# Patient Record
Sex: Female | Born: 1980
Health system: Southern US, Community
[De-identification: ages and names within clinical notes are randomized; demographics above are authoritative.]

## PROBLEM LIST (undated history)

## (undated) DIAGNOSIS — Z789 Other specified health status: Secondary | ICD-10-CM

## (undated) DIAGNOSIS — D62 Acute posthemorrhagic anemia: Secondary | ICD-10-CM

## (undated) HISTORY — PX: WISDOM TOOTH EXTRACTION: SHX21

## (undated) HISTORY — PX: LEEP: SHX91

---

## 1999-10-04 ENCOUNTER — Other Ambulatory Visit: Admission: RE | Admit: 1999-10-04 | Discharge: 1999-10-04 | Payer: Self-pay | Admitting: Internal Medicine

## 2001-04-24 ENCOUNTER — Other Ambulatory Visit: Admission: RE | Admit: 2001-04-24 | Discharge: 2001-04-24 | Payer: Self-pay | Admitting: Internal Medicine

## 2003-09-08 ENCOUNTER — Other Ambulatory Visit: Admission: RE | Admit: 2003-09-08 | Discharge: 2003-09-08 | Payer: Self-pay | Admitting: Internal Medicine

## 2003-11-04 ENCOUNTER — Ambulatory Visit (HOSPITAL_COMMUNITY): Admission: RE | Admit: 2003-11-04 | Discharge: 2003-11-04 | Payer: Self-pay | Admitting: *Deleted

## 2006-04-11 LAB — CONVERTED CEMR LAB
Pap Smear: ABNORMAL
Pap Smear: NORMAL

## 2006-05-15 ENCOUNTER — Encounter (INDEPENDENT_AMBULATORY_CARE_PROVIDER_SITE_OTHER): Payer: Self-pay | Admitting: Internal Medicine

## 2006-05-15 ENCOUNTER — Ambulatory Visit: Payer: Self-pay | Admitting: Internal Medicine

## 2006-05-15 LAB — CONVERTED CEMR LAB
Hemoglobin, Urine: NEGATIVE
Ketones, ur: NEGATIVE mg/dL
Leukocytes, UA: NEGATIVE
Nitrite: NEGATIVE
Protein, ur: NEGATIVE mg/dL
RBC / HPF: NONE SEEN (ref <3)
Urobilinogen, UA: 0.2 (ref 0.0–1.0)
pH: 7 (ref 5.0–8.0)

## 2011-02-13 NOTE — L&D Delivery Note (Signed)
Delivery Note At 11:31 AM a viable female was delivered via Vaginal, Spontaneous Delivery (Presentation: ; Occiput ).  APGAR: 9, 9; weight 7 lb 0.9 oz (3200 g).   Placenta status: Intact, Spontaneous.  Cord: 3 vessels with the following complications: None.  Cord pH: not needed  Anesthesia: Local  Episiotomy:  Lacerations: 2nd degree Suture Repair: 2.0 3.0 vicryl rapide Est. Blood Loss (mL): 500  Mom to postpartum.  Baby to nursery-stable Planning circ.  Zohar Laing A. 10/22/2011, 11:52 AM

## 2011-03-14 LAB — OB RESULTS CONSOLE ANTIBODY SCREEN: Antibody Screen: NEGATIVE

## 2011-05-08 LAB — OB RESULTS CONSOLE GC/CHLAMYDIA
Chlamydia: NEGATIVE
Gonorrhea: NEGATIVE

## 2011-05-08 LAB — OB RESULTS CONSOLE RPR: RPR: REACTIVE

## 2011-05-08 LAB — OB RESULTS CONSOLE HEPATITIS B SURFACE ANTIGEN: Hepatitis B Surface Ag: NEGATIVE

## 2011-10-19 ENCOUNTER — Inpatient Hospital Stay (HOSPITAL_COMMUNITY): Admission: AD | Admit: 2011-10-19 | Payer: Self-pay | Source: Ambulatory Visit | Admitting: Obstetrics

## 2011-10-19 ENCOUNTER — Other Ambulatory Visit: Payer: Self-pay | Admitting: Obstetrics

## 2011-10-22 ENCOUNTER — Inpatient Hospital Stay (HOSPITAL_COMMUNITY)
Admission: RE | Admit: 2011-10-22 | Discharge: 2011-10-24 | DRG: 775 | Disposition: A | Discharge status: Home / Self Care | Payer: 59 | Source: Ambulatory Visit | Attending: Obstetrics | Admitting: Obstetrics

## 2011-10-22 ENCOUNTER — Encounter (HOSPITAL_COMMUNITY): Payer: Self-pay

## 2011-10-22 DIAGNOSIS — D62 Acute posthemorrhagic anemia: Secondary | ICD-10-CM | POA: Diagnosis not present

## 2011-10-22 DIAGNOSIS — O9903 Anemia complicating the puerperium: Secondary | ICD-10-CM | POA: Diagnosis not present

## 2011-10-22 HISTORY — DX: Acute posthemorrhagic anemia: D62

## 2011-10-22 HISTORY — DX: Other specified health status: Z78.9

## 2011-10-22 LAB — CBC
MCH: 30.7 pg (ref 26.0–34.0)
MCHC: 34.7 g/dL (ref 30.0–36.0)
MCV: 88.4 fL (ref 78.0–100.0)
Platelets: 230 10*3/uL (ref 150–400)
RDW: 13 % (ref 11.5–15.5)
WBC: 12.3 10*3/uL — ABNORMAL HIGH (ref 4.0–10.5)

## 2011-10-22 LAB — TYPE AND SCREEN
ABO/RH(D): O NEG
DAT, IgG: NEGATIVE

## 2011-10-22 MED ORDER — FLEET ENEMA 7-19 GM/118ML RE ENEM: 1.0000 | ENEMA | Freq: Every day | RECTAL | Status: DC | PRN

## 2011-10-22 MED ORDER — OXYCODONE-ACETAMINOPHEN 5-325 MG PO TABS
1.0000 | ORAL_TABLET | ORAL | Status: DC | PRN
  Administered 2011-10-23 (×3): 1 via ORAL
  Filled 2011-10-22: qty 2
  Filled 2011-10-22 (×2): qty 1

## 2011-10-22 MED ORDER — LACTATED RINGERS IV SOLN
500.0000 mL | INTRAVENOUS | Status: DC | PRN
  Administered 2011-10-22: 1000 mL via INTRAVENOUS

## 2011-10-22 MED ORDER — DIPHENHYDRAMINE HCL 25 MG PO CAPS: 25.0000 mg | ORAL_CAPSULE | Freq: Four times a day (QID) | ORAL | Status: DC | PRN

## 2011-10-22 MED ORDER — SENNOSIDES-DOCUSATE SODIUM 8.6-50 MG PO TABS
2.0000 | ORAL_TABLET | Freq: Every day | ORAL | Status: DC
  Administered 2011-10-22: 2 via ORAL

## 2011-10-22 MED ORDER — OXYCODONE-ACETAMINOPHEN 5-325 MG PO TABS: 1.0000 | ORAL_TABLET | ORAL | Status: DC | PRN

## 2011-10-22 MED ORDER — ZOLPIDEM TARTRATE 5 MG PO TABS: 5.0000 mg | ORAL_TABLET | Freq: Every evening | ORAL | Status: DC | PRN

## 2011-10-22 MED ORDER — TETANUS-DIPHTH-ACELL PERTUSSIS 5-2.5-18.5 LF-MCG/0.5 IM SUSP: 0.5000 mL | Freq: Once | INTRAMUSCULAR | Status: DC

## 2011-10-22 MED ORDER — IBUPROFEN 600 MG PO TABS: 600.0000 mg | ORAL_TABLET | Freq: Four times a day (QID) | ORAL | Status: DC | PRN

## 2011-10-22 MED ORDER — PRENATAL MULTIVITAMIN CH
1.0000 | ORAL_TABLET | Freq: Every day | ORAL | Status: DC
  Administered 2011-10-22 – 2011-10-24 (×3): 1 via ORAL
  Filled 2011-10-22 (×3): qty 1

## 2011-10-22 MED ORDER — ONDANSETRON HCL 4 MG/2ML IJ SOLN: 4.0000 mg | Freq: Four times a day (QID) | INTRAMUSCULAR | Status: DC | PRN

## 2011-10-22 MED ORDER — LACTATED RINGERS IV SOLN: INTRAVENOUS | Status: DC

## 2011-10-22 MED ORDER — LIDOCAINE HCL (PF) 1 % IJ SOLN
30.0000 mL | INTRAMUSCULAR | Status: DC | PRN
  Administered 2011-10-22: 30 mL via SUBCUTANEOUS
  Filled 2011-10-22: qty 30

## 2011-10-22 MED ORDER — FLEET ENEMA 7-19 GM/118ML RE ENEM: 1.0000 | ENEMA | RECTAL | Status: DC | PRN

## 2011-10-22 MED ORDER — OXYTOCIN 40 UNITS IN LACTATED RINGERS INFUSION - SIMPLE MED
1.0000 m[IU]/min | INTRAVENOUS | Status: DC
  Filled 2011-10-22: qty 1000

## 2011-10-22 MED ORDER — TERBUTALINE SULFATE 1 MG/ML IJ SOLN: 0.2500 mg | Freq: Once | INTRAMUSCULAR | Status: DC | PRN

## 2011-10-22 MED ORDER — BUTORPHANOL TARTRATE 1 MG/ML IJ SOLN: 1.0000 mg | INTRAMUSCULAR | Status: DC | PRN

## 2011-10-22 MED ORDER — SIMETHICONE 80 MG PO CHEW: 80.0000 mg | CHEWABLE_TABLET | ORAL | Status: DC | PRN

## 2011-10-22 MED ORDER — BUTORPHANOL TARTRATE 1 MG/ML IJ SOLN
INTRAMUSCULAR | Status: AC
  Filled 2011-10-22: qty 1

## 2011-10-22 MED ORDER — OXYTOCIN 40 UNITS IN LACTATED RINGERS INFUSION - SIMPLE MED
62.5000 mL/h | Freq: Once | INTRAVENOUS | Status: AC
  Administered 2011-10-22: 62.5 mL/h via INTRAVENOUS

## 2011-10-22 MED ORDER — OXYTOCIN BOLUS FROM INFUSION
500.0000 mL | Freq: Once | INTRAVENOUS | Status: DC
  Filled 2011-10-22: qty 500

## 2011-10-22 MED ORDER — WITCH HAZEL-GLYCERIN EX PADS: 1.0000 "application " | MEDICATED_PAD | CUTANEOUS | Status: DC | PRN

## 2011-10-22 MED ORDER — ONDANSETRON HCL 4 MG PO TABS: 4.0000 mg | ORAL_TABLET | ORAL | Status: DC | PRN

## 2011-10-22 MED ORDER — ONDANSETRON HCL 4 MG/2ML IJ SOLN: 4.0000 mg | INTRAMUSCULAR | Status: DC | PRN

## 2011-10-22 MED ORDER — CITRIC ACID-SODIUM CITRATE 334-500 MG/5ML PO SOLN: 30.0000 mL | ORAL | Status: DC | PRN

## 2011-10-22 MED ORDER — IBUPROFEN 600 MG PO TABS
600.0000 mg | ORAL_TABLET | Freq: Four times a day (QID) | ORAL | Status: DC
  Administered 2011-10-22 – 2011-10-24 (×8): 600 mg via ORAL
  Filled 2011-10-22 (×8): qty 1

## 2011-10-22 MED ORDER — BISACODYL 10 MG RE SUPP: 10.0000 mg | Freq: Every day | RECTAL | Status: DC | PRN

## 2011-10-22 MED ORDER — DIBUCAINE 1 % RE OINT: 1.0000 "application " | TOPICAL_OINTMENT | RECTAL | Status: DC | PRN

## 2011-10-22 MED ORDER — ACETAMINOPHEN 325 MG PO TABS: 650.0000 mg | ORAL_TABLET | ORAL | Status: DC | PRN

## 2011-10-22 MED ORDER — LANOLIN HYDROUS EX OINT: TOPICAL_OINTMENT | CUTANEOUS | Status: DC | PRN

## 2011-10-22 MED ORDER — BENZOCAINE-MENTHOL 20-0.5 % EX AERO
1.0000 "application " | INHALATION_SPRAY | CUTANEOUS | Status: DC | PRN
  Filled 2011-10-22: qty 56

## 2011-10-22 NOTE — H&P (Signed)
Jill Camacho is a 31 y.o. G1P0 at [redacted]w[redacted]d presenting for IOL. Pt notes rare contractions. Good fetal movement, No vaginal bleeding, not leaking fluid.  P[facility]are at [facility] since first trimester. - SAB x 2 - h/o CIN 2, laser in 2005  OB History    Grav Para Term Preterm Abortions TAB SAB Ect Mult Living   1              No past medical history on file. No past surgical history on file. Family History: family history is not on file. Social History:  does not have a smoking history on file. She does not have any smokeless tobacco history on file. Her alcohol and drug histories not on file. RN at [employer]  All: PCN causes rash Meds: PNV, Pepcid  Review of Systems - Negative except occasional contraction     There were no vitals taken for this visit.  Physical Exam:  Gen: well appearing, no distress CV: RRR Pulm: CTAB Back: no CVAT Abd: gravid, NT, no RUQ pain LE: trace edema, equal bilaterally, non-tender Toco: rare FH: baseline 130's, accelerations present, no deceleratons, 10 beat variability Cvx: 4/80%/ mid-post/ vtx -1 AROM thin meconium  Prenatal labs: ABO, Rh:   O neg Antibody:  neg Rubella:  immune RPR: Reactive (03/26 0000)  HBsAg: Negative (03/26 0000)  HIV: Non-reactive (03/26 0000)  GBS: Negative (08/12 0000)  1 hr Glucola normal  Genetic screening nl quad Anatomy US nl   Assessment/Plan: 31 y.o. G1P0 at [redacted]w[redacted]d IOL with ripe cervix and past EDC. AROM now, will give pt 2 hrs to see if enters active labor, if not will start pitocin 35munit/min and increase by 15munit/min q 15 min Thin meconium, cont to monitor Reactive fetal testing    Jill Camacho A. 10/22/2011, 7:59 AM

## 2011-10-22 NOTE — Plan of Care (Signed)
Problem: Consults Goal: Birthing Suites Patient Information Press F2 to bring up selections list   Pt > [redacted] weeks EGA     

## 2011-10-23 LAB — CBC
Platelets: 181 10*3/uL (ref 150–400)
RBC: 2.86 MIL/uL — ABNORMAL LOW (ref 3.87–5.11)
RDW: 13.2 % (ref 11.5–15.5)
WBC: 18.8 10*3/uL — ABNORMAL HIGH (ref 4.0–10.5)

## 2011-10-23 MED ORDER — DOCUSATE SODIUM 100 MG PO CAPS
100.0000 mg | ORAL_CAPSULE | Freq: Two times a day (BID) | ORAL | Status: DC
  Administered 2011-10-23 – 2011-10-24 (×3): 100 mg via ORAL
  Filled 2011-10-23 (×3): qty 1

## 2011-10-23 MED ORDER — POLYSACCHARIDE IRON COMPLEX 150 MG PO CAPS
150.0000 mg | ORAL_CAPSULE | Freq: Every day | ORAL | Status: DC
  Filled 2011-10-23: qty 1

## 2011-10-23 NOTE — Progress Notes (Signed)
PPD 1 SVD w 2nd degree LAC  S:  Reports feeling well - just tired             Tolerating po/ No nausea or vomiting             Bleeding is light             Pain controlled withprescription NSAID's including  motrin only             Up ad lib / ambulatory  Newborn breast-feeding  / Circumcision planned   O:  A & O x 3 NAD             VS: Blood pressure 108/72, pulse 104, temperature 98.7 F (37.1 C), temperature source Oral, resp. rate 18, height 5\' 7"  (1.702 m), weight 84.823 kg (187 lb), unknown if currently breastfeeding.  LABS: WBC/Hgb/Hct/Plts:  18.8/8.8/25.5/181 (09/10 0500)   I&O:   -500  Lungs: Clear and unlabored  Heart: regular rate and rhythm / no mumurs  Abdomen: soft, non-tender, non-distended              Fundus: firm, non-tender, U-1  Perineum: mild edema  Lochia: light  Extremities: 1+ dependent edema, no calf pain or tenderness    A: PPD # 1 SVD with 2nd degree LAC repaired             Acute blood loss anemia - hemodynamically stable  Doing well - stable status  P:  Routine post partum orders  Start iron and colace - continue for 6 weeks / repeat cbc at postpartum visit             Anticipate discharge tomorrow  Marlinda Mike CNM, MSN 10/23/2011, 8:47 AM

## 2011-10-23 NOTE — Progress Notes (Signed)
Patient complains of "popping" sound and feel in lower back as she stands up.  She also states that she felt a "pop" at delivery. Instructed patient to discuss with OB in am.  Patient feels no pain or numbness with "popping".

## 2011-10-24 ENCOUNTER — Encounter (HOSPITAL_COMMUNITY): Payer: Self-pay

## 2011-10-24 DIAGNOSIS — D62 Acute posthemorrhagic anemia: Secondary | ICD-10-CM

## 2011-10-24 HISTORY — DX: Acute posthemorrhagic anemia: D62

## 2011-10-24 MED ORDER — DSS 100 MG PO CAPS: 100.0000 mg | ORAL_CAPSULE | Freq: Two times a day (BID) | ORAL | Status: AC

## 2011-10-24 MED ORDER — POLYSACCHARIDE IRON COMPLEX 150 MG PO CAPS: 150.0000 mg | ORAL_CAPSULE | Freq: Two times a day (BID) | ORAL | Status: DC

## 2011-10-24 MED ORDER — IBUPROFEN 600 MG PO TABS: 600.0000 mg | ORAL_TABLET | Freq: Four times a day (QID) | ORAL | Status: AC

## 2011-10-24 NOTE — Discharge Summary (Signed)
Obstetric Discharge Summary Reason for Admission: induction of labor and post dates Prenatal Procedures: ultrasound Intrapartum Procedures: spontaneous vaginal delivery Postpartum Procedures: none Complications-Operative and Postpartum: 2nd degree perineal laceration Hemoglobin  Date Value Range Status  10/23/2011 8.8* 12.0 - 15.0 g/dL Final     DELTA CHECK NOTED     REPEATED TO VERIFY     HCT  Date Value Range Status  10/23/2011 25.5* 36.0 - 46.0 % Final    Physical Exam:  General: alert, cooperative and mild distress Lochia: appropriate Uterine Fundus: firm Incision: healing well DVT Evaluation: Negative Homan's sign. No significant calf/ankle edema.  Discharge Diagnoses: Term Pregnancy-delivered and acute blood loss anemia / asymptomatic  Discharge Information: Date: 10/24/2011 Activity: pelvic rest Diet: routine Medications: PNV, Ibuprofen, Colace and Iron Condition: stable Instructions: refer to practice specific booklet Discharge to: home Follow-up Information    Follow up with [facility] MD. Schedule an appointment as soon as possible for a visit in 6 weeks.   Contact information:   [address] [place] [address] [phone]          Newborn Data: Live born female  Birth Weight: 7 lb 0.9 oz (3201 g) APGAR: 9, 9  Home with mother.  Jill Camacho,Jill Camacho 10/24/2011, 9:35 AM

## 2011-10-24 NOTE — Progress Notes (Signed)
Post Partum Day #2            Information for the patient's newborn:  Dynesha, Woolen [[id number]]  female   / circumcision done Feeding: breast  Subjective: No HA, SOB, CP, F/C, breast symptoms. Pain controlled with Motrin. Normal vaginal bleeding, no clots. Denies SOB/CP/dizziness.    Teary, frustrated w/ breastfeeding / latch, working w/ [employer] but worried will not be able to continue at home.   Objective:  Temp:  [98 F (36.7 C)-98.4 F (36.9 C)] 98 F (36.7 C) (09/11 0622) Pulse Rate:  [68-116] 95  (09/11 0622) Resp:  [18-20] 18  (09/11 0622) BP: (103-121)/(61-68) 103/61 mmHg (09/11 0622)  No intake or output data in the 24 hours ending 10/24/11 0916     Basename 10/23/11 0500 10/22/11 0755  WBC 18.8* 12.3*  HGB 8.8* 13.8  HCT 25.5* 39.8  PLT 181 230    Blood type: --/--/O NEG (09/09 0755) / infant Rh neg Rubella: Immune (03/26 0000)    Physical Exam:  General: alert, cooperative and mild distress Uterine Fundus: firm Lochia: appropriate Perineum: repair intact, edema none DVT Evaluation: Negative Homan's sign. No significant calf/ankle edema.    Assessment/Plan: PPD # 2 / 31 y.o., [id number] S/P:spontaneous vaginal   Active Problems:  Postpartum care following vaginal delivery w/ 2nd degree LAC (9/9)  Acute blood loss anemia  Asymptomatic, will continue oral FE at home x 4-6 wks     normal postpartum exam  Continue current postpartum care  Reviewed PP blues vs PPD, reportable signs  Outpatient LC support  D/C home   LOS: 2 days   Shiva Sahagian, CNM, MSN 10/24/2011, 9:16 AM

## 2011-10-29 ENCOUNTER — Ambulatory Visit (HOSPITAL_COMMUNITY)
Admission: RE | Admit: 2011-10-29 | Discharge: 2011-10-29 | Disposition: A | Discharge status: Home / Self Care | Payer: 59 | Source: Ambulatory Visit | Attending: Obstetrics | Admitting: Obstetrics

## 2011-10-29 NOTE — Progress Notes (Signed)
Infant Lactation Consultation Outpatient Visit Note  Patient Name: Jill Camacho Date of Birth: [birth year] Birth Weight:  7# Gestational Age at Delivery: Gestational Age: <None> Type of Delivery: Vaginal  Breastfeeding History Frequency of Breastfeeding: 8-12 times a day Length of Feeding: 15-30 minutes Voids: 9 Stools:8   Supplementing / Method: Pumping:  Type of Pump:Freestyle   Frequency:last session was 24 hours ago  Volume: 20 ml   Comments: Feedings have been with the NS.  Today we were able to latch with out it and as the feeding progressed latching became easier.  She will continue this at home and come in for a feeding assessment next week.   Consultation Evaluation:  Initial Feeding Assessment: Pre-feed [id number] Post-feed Weight:3132 Amount Transferred:16 Comments:Fed on the Rt breast.  Mother reports that this side produces less milk.  Hand expressed about 2 ml after BF.  Ate for 15 minutes  Additional Feeding Assessment: Pre-feed Weight:3132 Post-feed Weight:3160 Amount Transferred:28 Comments:Fed on the left for 25 minutes.   :  Total Breast milk Transferred this Visit: 44 Total Supplement Given: 2 ml Expressed BM  Additional Interventions:   Follow-Up Visit scheduled for next week to observe feeding and answer questions.  MOther is to feed on cue, use off center latch, hand express. One hour after feeding, pump for 15 minutes.  Do this twice a day.     Soyla Dryer 10/29/2011, 2:21 PM

## 2011-11-06 ENCOUNTER — Ambulatory Visit (HOSPITAL_COMMUNITY): Admission: RE | Admit: 2011-11-06 | Payer: 59 | Source: Ambulatory Visit

## 2013-02-12 NOTE — L&D Delivery Note (Signed)
Delivery Note At 8:15 AM a viable and healthy female was delivered via Vaginal, Spontaneous Delivery (Presentation: Left Occiput Anterior).  APGAR: 9, 9; weight  pending .   Placenta status: Intact, Spontaneous. Not sent Cord: 3 vessels with the following complications: Short.  Cord pH: none  Anesthesia: Local  Episiotomy: None Lacerations: 2nd degree;Perineal Suture Repair: 3.0 chromic Est. Blood Loss (mL): 300  Mom to postpartum.  Baby to Couplet care / Skin to Skin.  Jill Camacho A 01/07/2014, 8:51 AM

## 2013-12-14 ENCOUNTER — Encounter (HOSPITAL_COMMUNITY): Payer: Self-pay

## 2014-01-07 ENCOUNTER — Encounter (HOSPITAL_COMMUNITY): Payer: Self-pay | Admitting: *Deleted

## 2014-01-07 ENCOUNTER — Inpatient Hospital Stay (HOSPITAL_COMMUNITY)
Admission: AD | Admit: 2014-01-07 | Discharge: 2014-01-08 | DRG: 775 | Disposition: A | Discharge status: Home / Self Care | Payer: 59 | Source: Ambulatory Visit | Attending: Obstetrics and Gynecology | Admitting: Obstetrics and Gynecology

## 2014-01-07 DIAGNOSIS — O09523 Supervision of elderly multigravida, third trimester: Secondary | ICD-10-CM | POA: Diagnosis present

## 2014-01-07 DIAGNOSIS — Z3A40 40 weeks gestation of pregnancy: Secondary | ICD-10-CM | POA: Diagnosis present

## 2014-01-07 DIAGNOSIS — O480 Post-term pregnancy: Principal | ICD-10-CM | POA: Diagnosis present

## 2014-01-07 DIAGNOSIS — IMO0001 Reserved for inherently not codable concepts without codable children: Secondary | ICD-10-CM

## 2014-01-07 LAB — TYPE AND SCREEN
ABO/RH(D): O NEG
Antibody Screen: NEGATIVE

## 2014-01-07 LAB — CBC
HCT: 39.1 % (ref 36.0–46.0)
HEMOGLOBIN: 14.1 g/dL (ref 12.0–15.0)
MCH: 32.8 pg (ref 26.0–34.0)
MCHC: 36.1 g/dL — ABNORMAL HIGH (ref 30.0–36.0)
MCV: 90.9 fL (ref 78.0–100.0)
Platelets: 227 10*3/uL (ref 150–400)
RBC: 4.3 MIL/uL (ref 3.87–5.11)
RDW: 13.3 % (ref 11.5–15.5)
WBC: 16.7 10*3/uL — ABNORMAL HIGH (ref 4.0–10.5)

## 2014-01-07 LAB — RPR

## 2014-01-07 MED ORDER — OXYCODONE-ACETAMINOPHEN 5-325 MG PO TABS: 2.0000 | ORAL_TABLET | ORAL | Status: DC | PRN

## 2014-01-07 MED ORDER — LACTATED RINGERS IV SOLN
INTRAVENOUS | Status: DC
  Administered 2014-01-07: 08:00:00 via INTRAVENOUS

## 2014-01-07 MED ORDER — ONDANSETRON HCL 4 MG/2ML IJ SOLN: 4.0000 mg | Freq: Four times a day (QID) | INTRAMUSCULAR | Status: DC | PRN

## 2014-01-07 MED ORDER — SIMETHICONE 80 MG PO CHEW: 80.0000 mg | CHEWABLE_TABLET | ORAL | Status: DC | PRN

## 2014-01-07 MED ORDER — PRENATAL MULTIVITAMIN CH: 1.0000 | ORAL_TABLET | Freq: Every day | ORAL | Status: DC

## 2014-01-07 MED ORDER — ONDANSETRON HCL 4 MG/2ML IJ SOLN: 4.0000 mg | INTRAMUSCULAR | Status: DC | PRN

## 2014-01-07 MED ORDER — DIBUCAINE 1 % RE OINT: 1.0000 "application " | TOPICAL_OINTMENT | RECTAL | Status: DC | PRN

## 2014-01-07 MED ORDER — DIPHENHYDRAMINE HCL 25 MG PO CAPS: 25.0000 mg | ORAL_CAPSULE | Freq: Four times a day (QID) | ORAL | Status: DC | PRN

## 2014-01-07 MED ORDER — LACTATED RINGERS IV SOLN: 500.0000 mL | INTRAVENOUS | Status: DC | PRN

## 2014-01-07 MED ORDER — BUTORPHANOL TARTRATE 1 MG/ML IJ SOLN: 1.0000 mg | INTRAMUSCULAR | Status: DC | PRN

## 2014-01-07 MED ORDER — OXYCODONE-ACETAMINOPHEN 5-325 MG PO TABS: 1.0000 | ORAL_TABLET | ORAL | Status: DC | PRN

## 2014-01-07 MED ORDER — FLEET ENEMA 7-19 GM/118ML RE ENEM: 1.0000 | ENEMA | RECTAL | Status: DC | PRN

## 2014-01-07 MED ORDER — OXYTOCIN 40 UNITS IN LACTATED RINGERS INFUSION - SIMPLE MED: 62.5000 mL/h | INTRAVENOUS | Status: DC

## 2014-01-07 MED ORDER — OXYTOCIN 40 UNITS IN LACTATED RINGERS INFUSION - SIMPLE MED
INTRAVENOUS | Status: AC
  Administered 2014-01-07: 40 [IU]
  Filled 2014-01-07: qty 1000

## 2014-01-07 MED ORDER — WITCH HAZEL-GLYCERIN EX PADS: 1.0000 "application " | MEDICATED_PAD | CUTANEOUS | Status: DC | PRN

## 2014-01-07 MED ORDER — POLYSACCHARIDE IRON COMPLEX 150 MG PO CAPS
150.0000 mg | ORAL_CAPSULE | Freq: Two times a day (BID) | ORAL | Status: DC
  Administered 2014-01-07 – 2014-01-08 (×3): 150 mg via ORAL
  Filled 2014-01-07 (×3): qty 1

## 2014-01-07 MED ORDER — BENZOCAINE-MENTHOL 20-0.5 % EX AERO
1.0000 "application " | INHALATION_SPRAY | CUTANEOUS | Status: DC | PRN
  Filled 2014-01-07: qty 56

## 2014-01-07 MED ORDER — SENNOSIDES-DOCUSATE SODIUM 8.6-50 MG PO TABS
2.0000 | ORAL_TABLET | ORAL | Status: DC
  Administered 2014-01-08: 2 via ORAL
  Filled 2014-01-07: qty 2

## 2014-01-07 MED ORDER — CITRIC ACID-SODIUM CITRATE 334-500 MG/5ML PO SOLN: 30.0000 mL | ORAL | Status: DC | PRN

## 2014-01-07 MED ORDER — ZOLPIDEM TARTRATE 5 MG PO TABS: 5.0000 mg | ORAL_TABLET | Freq: Every evening | ORAL | Status: DC | PRN

## 2014-01-07 MED ORDER — ACETAMINOPHEN 325 MG PO TABS
650.0000 mg | ORAL_TABLET | ORAL | Status: DC | PRN
Start: 2014-01-07 — End: 2014-01-07

## 2014-01-07 MED ORDER — LIDOCAINE HCL (PF) 1 % IJ SOLN
30.0000 mL | INTRAMUSCULAR | Status: DC | PRN
  Filled 2014-01-07: qty 30

## 2014-01-07 MED ORDER — ONDANSETRON HCL 4 MG PO TABS: 4.0000 mg | ORAL_TABLET | ORAL | Status: DC | PRN

## 2014-01-07 MED ORDER — LANOLIN HYDROUS EX OINT: TOPICAL_OINTMENT | CUTANEOUS | Status: DC | PRN

## 2014-01-07 MED ORDER — OXYTOCIN BOLUS FROM INFUSION: 500.0000 mL | INTRAVENOUS | Status: DC

## 2014-01-07 MED ORDER — LIDOCAINE HCL (PF) 1 % IJ SOLN
INTRAMUSCULAR | Status: AC
  Administered 2014-01-07: 08:00:00
  Filled 2014-01-07: qty 30

## 2014-01-07 MED ORDER — IBUPROFEN 600 MG PO TABS
600.0000 mg | ORAL_TABLET | Freq: Four times a day (QID) | ORAL | Status: DC
  Administered 2014-01-07 – 2014-01-08 (×4): 600 mg via ORAL
  Filled 2014-01-07 (×4): qty 1

## 2014-01-07 MED ORDER — MISOPROSTOL 200 MCG PO TABS
ORAL_TABLET | ORAL | Status: AC
  Filled 2014-01-07: qty 4

## 2014-01-07 NOTE — MAU Note (Signed)
Patient states she is having contractions every 2-3 minutes with bloody show. Denies leaking and reports good fetal movement.

## 2014-01-07 NOTE — Progress Notes (Signed)
Delivery of baby

## 2014-01-07 NOTE — Lactation Note (Signed)
This note was copied from the chart of Jill Camacho Mccarver. Lactation Consultation Note Initial visit at 11 hours of age.  Mom is noted to have wide spaced breast and reports positive breast changes during her pregnancy. Mom reports low milk supply with previous child that didn't breastfeed well. Mom is reporting baby is already latching well and she feels better about this experience.   Mom is able to see colostrum with hand expression.  Baby place STS in football hold on right breast and maintained latch for about 10 minutes with few sucking bursts.  Mom denies pain.  [facility] given and discussed.  Encouraged to feed with early cues on demand.  Early newborn behavior discussed.  Mom to call for assist as needed.      Patient Name: Jill Camacho Orwick ZOXWR'UToday's Date: 01/07/2014 Reason for consult: Initial assessment   Maternal Data Has patient been taught Hand Expression?: Yes Does the patient have breastfeeding experience prior to this delivery?: Yes  Feeding Feeding Type: Breast Fed Length of feed: 10 min  LATCH Score/Interventions Latch: Repeated attempts needed to sustain latch, nipple held in mouth throughout feeding, stimulation needed to elicit sucking reflex. Intervention(s): Adjust position;Assist with latch;Breast massage;Breast compression  Audible Swallowing: A few with stimulation Intervention(s): Skin to skin;Hand expression;Alternate breast massage  Type of Nipple: Everted at rest and after stimulation  Comfort (Breast/Nipple): Soft / non-tender     Hold (Positioning): Assistance needed to correctly position infant at breast and maintain latch. Intervention(s): Skin to skin;Position options;Support Pillows;Breastfeeding basics reviewed  LATCH Score: 7  Lactation Tools Discussed/Used     Consult Status Consult Status: Follow-up Date: 01/08/14 Follow-up type: In-patient    Jannifer RodneyShoptaw, Jaylynn Mcaleer Lynn 01/07/2014, 9:24 PM

## 2014-01-07 NOTE — Progress Notes (Signed)
Starting to push

## 2014-01-07 NOTE — Plan of Care (Signed)
Problem: Phase I Progression Outcomes Goal: Voiding adequately Outcome: Completed/Met Date Met:  01/07/14 Goal: Foley catheter patent Outcome: Not Applicable Date Met:  14/70/92 Goal: OOB as tolerated unless otherwise ordered Outcome: Completed/Met Date Met:  01/07/14 Goal: VS, stable, temp < 100.4 degrees F Outcome: Completed/Met Date Met:  01/07/14 Goal: Initial discharge plan identified Outcome: Completed/Met Date Met:  01/07/14

## 2014-01-07 NOTE — H&P (Signed)
Jill Camacho is Camacho 33 y.o. female presenting in active labor @ 7 cm . Pt is 40 3/7 weeks. GBS cx neg. Uncomplicated PNC course Maternal Medical History:  Reason for admission: Contractions.   Contractions: Frequency: rare and regular.    Fetal activity: Perceived fetal activity is normal.    Prenatal complications: no prenatal complications Prenatal Complications - Diabetes: none.    OB History    Gravida Para Term Preterm AB TAB SAB Ectopic Multiple Living   4 1 1  2  2   1      Past Medical History  Diagnosis Date  . No pertinent past medical history   . Acute blood loss anemia 10/24/2011   Past Surgical History  Procedure Laterality Date  . Leep    . Wisdom tooth extraction     Family History: family history includes Cancer in her maternal grandfather, maternal grandmother, paternal grandfather, and paternal grandmother; Heart disease in her maternal grandfather and maternal grandmother. Social History:  reports that she has never smoked. She has never used smokeless tobacco. She reports that she does not drink alcohol or use illicit drugs.   Prenatal Transfer Tool  Maternal Diabetes: No Genetic Screening: Normal Maternal Ultrasounds/Referrals: Normal Fetal Ultrasounds or other Referrals:  None Maternal Substance Abuse:  No Significant Maternal Medications:  Meds include: Other: ASA Significant Maternal Lab Results:  Lab values include: Group B Strep negative, Rh negative Other Comments:  None   Review of Systems  All other systems reviewed and are negative.   Dilation: 7 Effacement (%): 100 Station: -2 Exam by::  (Dr Cherly Hensenousins) Blood pressure 128/67, pulse 137, temperature 99.3 F (37.4 C), temperature source Oral, resp. rate 18, height 5\' 7"  (1.702 m), weight 80.922 kg (178 lb 6.4 oz), unknown if currently breastfeeding. Exam Physical Exam  Constitutional: She is oriented to person, place, and time. She appears well-nourished.  HENT:  Head: Atraumatic.   Eyes: EOM are normal.  Neck: Neck supple.  Cardiovascular: Regular rhythm.   Respiratory: Effort normal.  GI: Soft.  Musculoskeletal: She exhibits no edema.  Neurological: She is alert and oriented to person, place, and time.  Skin: Skin is warm and dry.  Psychiatric: She has Camacho normal mood and affect.    Prenatal labs: ABO, Rh:  O neg Antibody:  neg Rubella:  Immune RPR:   NR HBsAg:   neg HIV:   NR GBS:   neg  Assessment/Plan: Active labor Postterm Rh neg P) admit routine labs. Amniotomy. Rhophylac if indicated. Analgesic prn   Jill Camacho 01/07/2014, 7:55 AM

## 2014-01-07 NOTE — Progress Notes (Signed)
S> breathing with ctx Desires natural labor Hx 3rd deg laceration  O: VE unchanged AROM clear fluid  Tracing: baseline 130 (+) accel Ctx q 2 mins  IMP: Active phase P) await labor. Controlled vaginal delivery given prior laceration anticipate SVD

## 2014-01-08 LAB — CBC
HEMATOCRIT: 30.6 % — AB (ref 36.0–46.0)
HEMOGLOBIN: 10.4 g/dL — AB (ref 12.0–15.0)
MCH: 32.1 pg (ref 26.0–34.0)
MCHC: 34 g/dL (ref 30.0–36.0)
MCV: 94.4 fL (ref 78.0–100.0)
Platelets: 216 10*3/uL (ref 150–400)
RBC: 3.24 MIL/uL — ABNORMAL LOW (ref 3.87–5.11)
RDW: 13.6 % (ref 11.5–15.5)
WBC: 15.2 10*3/uL — ABNORMAL HIGH (ref 4.0–10.5)

## 2014-01-08 MED ORDER — IBUPROFEN 600 MG PO TABS: 600.0000 mg | ORAL_TABLET | Freq: Four times a day (QID) | ORAL | Status: AC

## 2014-01-08 NOTE — Plan of Care (Signed)
Problem: Discharge Progression Outcomes Goal: Activity appropriate for discharge plan Outcome: Completed/Met Date Met:  01/08/14

## 2014-01-08 NOTE — Progress Notes (Signed)
Patient ID: Jill Camacho, female   DOB: 04-09-1980, 33 y.o.   MRN: [id number] INTERVAL NOTE:  S:   Sitting in bed, BFing, min cramping, (+) voids, small bleed, denies HA/NV/dizziness  O:   VSS, AAO x 3, NAD  1 FB below U  Scant lochia  A / P:   PPD #0  Stable post partum  Routine PP orders  Kenard GowerAWSON, Esmae Donathan, M, MSN, CNM 01/07/2014, 12:00 PM

## 2014-01-08 NOTE — Discharge Instructions (Signed)
Breast Pumping Tips °If you are breastfeeding, there may be times when you cannot feed your baby directly. Returning to work or going on a trip are common examples. Pumping allows you to store breast milk and feed it to your baby later.  °You may not get much milk when you first start to pump. Your breasts should start to make more after a few days. If you pump at the times you usually feed your baby, you may be able to keep making enough milk to feed your baby without also using formula. The more often you pump, the more milk you will produce.  °WHEN SHOULD I PUMP?  °· You can begin to pump soon after delivery. However, some experts recommend waiting about 4 weeks before giving your infant a bottle to make sure breastfeeding is going well.  °· If you plan to return to work, begin pumping a few weeks before. This will help you develop techniques that work best for you. It also lets you build up a supply of breast milk.   °· When you are with your infant, feed on demand and pump after each feeding.   °· When you are away from your infant for several hours, pump for about 15 minutes every 2-3 hours. Pump both breasts at the same time if you can.   °· If your infant has a formula feeding, make sure to pump around the same time.     °· If you drink any alcohol, wait 2 hours before pumping.   °HOW DO I PREPARE TO PUMP? °Your let-down reflex is the natural reaction to stimulation that makes your breast milk flow. It is easier to stimulate this reflex when you are relaxed. Find relaxation techniques that work for you. If you have difficulty with your let-down reflex, try these methods:  °· Smell one of your infant's blankets or an item of clothing.   °· Look at a picture or video of your infant.   °· Sit in a quiet, private space.   °· Massage the breast you plan to pump.   °· Place soothing warmth on the breast.   °· Play relaxing music.   °WHAT ARE SOME GENERAL BREAST PUMPING TIPS? °· Wash your hands before you pump. You  do not need to wash your nipples or breasts. °· There are three ways to pump. °¨ You can use your hand to massage and compress your breast. °¨ You can use a handheld manual pump. °¨ You can use an electric pump.   °· Make sure the suction cup (flange) on the breast pump is the right size. Place the flange directly over the nipple. If it is the wrong size or placed the wrong way, it may be painful and cause nipple damage.   °· If pumping is uncomfortable, apply a small amount of purified or modified lanolin to your nipple and areola. °· If you are using an electric pump, adjust the speed and suction power to be more comfortable. °· If pumping is painful or if you are not getting very much milk, you may need a different type of pump. A lactation consultant can help you determine what type of pump to use.   °· Keep a full water bottle near you at all times. Drinking lots of fluid helps you make more milk.  °· You can store your milk to use later. Pumped breast milk can be stored in a sealable, sterile container or plastic bag. Label all stored breast milk with the date you pumped it. °¨ Milk can stay out at room temperature for up to 8 hours. °¨   You can store your milk in the refrigerator for up to 8 days. °¨ You can store your milk in the freezer for 3 months. Thaw frozen milk using warm water. Do not put it in the microwave. °· Do not smoke. Smoking can lower your milk supply and harm your infant. If you need help quitting, ask your health care provider to recommend a program.   °WHEN SHOULD I CALL MY HEALTH CARE PROVIDER OR A LACTATION CONSULTANT? °· You are having trouble pumping. °· You are concerned that you are not making enough milk. °· You have nipple pain, soreness, or redness. °· You want to use birth control. Birth control pills may lower your milk supply. Talk to your health care provider about your options. °Document Released: 07/19/2009 Document Revised: 02/03/2013 Document Reviewed:  11/21/2012 °Exit[employer]® Patient Information ©2015 [employer]. This information is not intended to replace advice given to you by your health care provider. Make sure you discuss any questions you have with your health care provider. ° °Nutrition for the New Mother  °A new mother needs good health and nutrition so she can have energy to take care of a new baby. Whether a mother breastfeeds or formula feeds the baby, it is important to have a well-balanced diet. Foods from all the food groups should be chosen to meet the new mother's energy needs and to give her the nutrients needed for repair and healing.  °A HEALTHY EATING PLAN °The My [employer] plan for Moms outlines what you should eat to help you and your baby stay healthy. The energy and amount of food you need depends on whether or not you are breastfeeding. If you are breastfeeding you will need more nutrients. If you choose not to breastfeed, your nutrition goal should be to return to a healthy weight. Limiting calories may be needed if you are not breastfeeding.  °HOME CARE INSTRUCTIONS  °· For a personal plan based on your unique needs, see your Registered Dietitian or visit www.mypyramid.gov. °· Eat a variety of foods. The plan below will help guide you. The following chart has a suggested daily meal plan from the [employer] for Moms. °· Eat a variety of fruits and vegetables. °· Eat more dark green and orange vegetables and cooked dried beans. °· Make half your grains whole grains. Choose whole instead of refined grains. °· Choose low-fat or lean meats and poultry. °· Choose low-fat or fat-free dairy products like milk, cheese, or yogurt. °Fruits °· Breastfeeding: 2 cups °· Non-Breastfeeding: 2 cups °· What Counts as a serving? °¨ 1 cup of fruit or juice. °¨ ½ cup dried fruit. °Vegetables °· Breastfeeding: 3 cups °· Non-Breastfeeding: 2 ½ cups °· What Counts as a serving? °¨ 1 cup raw or cooked vegetables. °¨ Juice or 2 cups raw leafy  vegetables. °Grains °· Breastfeeding: 8 oz °· Non-Breastfeeding: 6 oz °· What Counts as a serving? °¨ 1 slice bread. °¨ 1 oz ready-to-eat cereal. °¨ ½ cup cooked pasta, rice, or cereal. °Meat and Beans °· Breastfeeding: 6 ½ oz °· Non-Breastfeeding: 5 ½ oz °· What Counts as a serving? °¨ 1 oz lean meat, poultry, or fish °¨ ¼ cup cooked dry beans °¨ ½ oz nuts or 1 egg °¨ 1 tbs peanut butter °Milk °· Breastfeeding: 3 cups °· Non-Breastfeeding: 3 cups °· What Counts as a serving? °¨ 1 cup milk. °¨ 8 oz yogurt. °¨ 1 ½ oz cheese. °¨ 2 oz processed cheese. °TIPS FOR THE BREASTFEEDING MOM °· Rapid weight   loss is not suggested when you are breastfeeding. By simply breastfeeding, you will be able to lose the weight gained during your pregnancy. Your caregiver can keep track of your weight and tell you if your weight loss is appropriate. °· Be sure to drink fluids. You may notice that you are thirstier than usual. A suggestion is to drink a glass of water or other beverage whenever you breastfeed. °· Avoid alcohol as it can be passed into your breast milk. °· Limit caffeine drinks to no more than 2 to 3 cups per day. °· You may need to keep taking your prenatal vitamin while you are breastfeeding. Talk with your caregiver about taking a vitamin or supplement. °RETURING TO A HEALTHY WEIGHT °· The My [employer] Plan for Moms will help you return to a healthy weight. It will also provide the nutrients you need. °· You may need to limit "empty" calories. These include: °¨ High fat foods like fried foods, fatty meats, fast food, butter, and mayonnaise. °¨ High sugar foods like sodas, jelly, candy, and sweets. °· Be physically active. Include 30 minutes of exercise or more each day. Choose an activity you like such as walking, swimming, biking, or aerobics. Check with your caregiver before you start to exercise. °Document Released: 05/08/2007 Document Revised: 04/23/2011 Document Reviewed: 05/08/2007 °ExitCare® Patient Information  ©2015 [employer]. This information is not intended to replace advice given to you by your health care provider. Make sure you discuss any questions you have with your health care provider. °Postpartum Depression and Baby Blues °The postpartum period begins right after the birth of a baby. During this time, there is often a great amount of joy and excitement. It is also a time of many changes in the life of the parents. Regardless of how many times a mother gives birth, each child brings new challenges and dynamics to the family. It is not unusual to have feelings of excitement along with confusing shifts in moods, emotions, and thoughts. All mothers are at risk of developing postpartum depression or the "baby blues." These mood changes can occur right after giving birth, or they may occur many months after giving birth. The baby blues or postpartum depression can be mild or severe. Additionally, postpartum depression can go away rather quickly, or it can be a long-term condition.  °CAUSES °Raised hormone levels and the rapid drop in those levels are thought to be a main cause of postpartum depression and the baby blues. A number of hormones change during and after pregnancy. Estrogen and progesterone usually decrease right after the delivery of your baby. The levels of thyroid hormone and various cortisol steroids also rapidly drop. Other factors that play a role in these mood changes include major life events and genetics.  °RISK FACTORS °If you have any of the following risks for the baby blues or postpartum depression, know what symptoms to watch out for during the postpartum period. Risk factors that may increase the likelihood of getting the baby blues or postpartum depression include: °· Having a personal or family history of depression.   °· Having depression while being pregnant.   °· Having premenstrual mood issues or mood issues related to oral contraceptives. °· Having a lot of life stress.   °· Having  marital conflict.   °· Lacking a social support network.   °· Having a baby with special needs.   °· Having health problems, such as diabetes.   °SIGNS AND SYMPTOMS °Symptoms of baby blues include: °· Brief changes in mood, such as going   from extreme happiness to sadness. °· Decreased concentration.   °· Difficulty sleeping.   °· Crying spells, tearfulness.   °· Irritability.   °· Anxiety.   °Symptoms of postpartum depression typically begin within the first month after giving birth. These symptoms include: °· Difficulty sleeping or excessive sleepiness.   °· Marked weight loss.   °· Agitation.   °· Feelings of worthlessness.   °· Lack of interest in activity or food.   °Postpartum psychosis is a very serious condition and can be dangerous. Fortunately, it is rare. Displaying any of the following symptoms is cause for immediate medical attention. Symptoms of postpartum psychosis include:  °· Hallucinations and delusions.   °· Bizarre or disorganized behavior.   °· Confusion or disorientation.   °DIAGNOSIS  °A diagnosis is made by an evaluation of your symptoms. There are no medical or lab tests that lead to a diagnosis, but there are various questionnaires that a health care provider may use to identify those with the baby blues, postpartum depression, or psychosis. Often, a screening tool called the Edinburgh Postnatal Depression Scale is used to diagnose depression in the postpartum period.  °TREATMENT °The baby blues usually goes away on its own in 1-2 weeks. Social support is often all that is needed. You will be encouraged to get adequate sleep and rest. Occasionally, you may be given medicines to help you sleep.  °Postpartum depression requires treatment because it can last several months or longer if it is not treated. Treatment may include individual or group therapy, medicine, or both to address any social, physiological, and psychological factors that may play a role in the depression. Regular exercise, a  healthy diet, rest, and social support may also be strongly recommended.  °Postpartum psychosis is more serious and needs treatment right away. Hospitalization is often needed. °HOME CARE INSTRUCTIONS °· Get as much rest as you can. Nap when the baby sleeps.   °· Exercise regularly. Some women find yoga and walking to be beneficial.   °· Eat a balanced and nourishing diet.   °· Do little things that you enjoy. Have a cup of tea, take a bubble bath, read your favorite magazine, or listen to your favorite music. °· Avoid alcohol.   °· Ask for help with household chores, cooking, grocery shopping, or running errands as needed. Do not try to do everything.   °· Talk to people close to you about how you are feeling. Get support from your partner, family members, friends, or other new moms. °· Try to stay positive in how you think. Think about the things you are grateful for.   °· Do not spend a lot of time alone.   °· Only take over-the-counter or prescription medicine as directed by your health care provider. °· Keep all your postpartum appointments.   °· Let your health care provider know if you have any concerns.   °SEEK MEDICAL CARE IF: °You are having a reaction to or problems with your medicine. °SEEK IMMEDIATE MEDICAL CARE IF: °· You have suicidal feelings.   °· You think you may harm the baby or someone else. °MAKE SURE YOU: °· Understand these instructions. °· Will watch your condition. °· Will get help right away if you are not doing well or get worse. °Document Released: 11/03/2003 Document Revised: 02/03/2013 Document Reviewed: 11/10/2012 °ExitCare® Patient Information ©2015 [employer]. This information is not intended to replace advice given to you by your health care provider. Make sure you discuss any questions you have with your health care provider. °Breastfeeding and Mastitis °Mastitis is inflammation of the breast tissue. It can occur in women who   are breastfeeding. This can make breastfeeding  painful. Mastitis will sometimes go away on its own. Your health care provider will help determine if treatment is needed. °CAUSES °Mastitis is often associated with a blocked milk (lactiferous) duct. This can happen when too much milk builds up in the breast. Causes of excess milk in the breast can include: °· Poor latch-on. If your baby is not latched onto the breast properly, she or he may not empty your breast completely while breastfeeding. °· Allowing too much time to pass between feedings. °· Wearing a bra or other clothing that is too tight. This puts extra pressure on the lactiferous ducts so milk does not flow through them as it should. °Mastitis can also be caused by a bacterial infection. Bacteria may enter the breast tissue through cuts or openings in the skin. In women who are breastfeeding, this may occur because of cracked or irritated skin. Cracks in the skin are often caused when your baby does not latch on properly to the breast. °SIGNS AND SYMPTOMS °· Swelling, redness, tenderness, and pain in an area of the breast. °· Swelling of the glands under the arm on the same side. °· Fever may or may not accompany mastitis. °If an infection is allowed to progress, a collection of pus (abscess) may develop. °DIAGNOSIS  °Your health care provider can usually diagnose mastitis based on your symptoms and a physical exam. Tests may be done to help confirm the diagnosis. These may include: °· Removal of pus from the breast by applying pressure to the area. This pus can be examined in the lab to determine which bacteria are present. If an abscess has developed, the fluid in the abscess can be removed with a needle. This can also be used to confirm the diagnosis and determine the bacteria present. In most cases, pus will not be present. °· Blood tests to determine if your body is fighting a bacterial infection. °· Mammogram or ultrasound tests to rule out other problems or diseases. °TREATMENT  °Mastitis that  occurs with breastfeeding will sometimes go away on its own. Your health care provider may choose to wait 24 hours after first seeing you to decide whether a prescription medicine is needed. If your symptoms are worse after 24 hours, your health care provider will likely prescribe an antibiotic medicine to treat the mastitis. He or she will determine which bacteria are most likely causing the infection and will then select an appropriate antibiotic medicine. This is sometimes changed based on the results of tests performed to identify the bacteria, or if there is no response to the antibiotic medicine selected. Antibiotic medicines are usually given by mouth. You may also be given medicine for pain. °HOME CARE INSTRUCTIONS °· Only take over-the-counter or prescription medicines for pain, fever, or discomfort as directed by your health care provider. °· If your health care provider prescribed an antibiotic medicine, take the medicine as directed. Make sure you finish it even if you start to feel better. °· Do not wear a tight or underwire bra. Wear a soft, supportive bra. °· Increase your fluid intake, especially if you have a fever. °· Continue to empty the breast. Your health care provider can tell you whether this milk is safe for your infant or needs to be thrown out. You may be told to stop nursing until your health care provider thinks it is safe for your baby. Use a breast pump if you are advised to stop nursing. °· Keep your nipples   clean and dry. °· Empty the first breast completely before going to the other breast. If your baby is not emptying your breasts completely for some reason, use a breast pump to empty your breasts. °· If you go back to work, pump your breasts while at work to stay in time with your nursing schedule. °· Avoid allowing your breasts to become overly filled with milk (engorged). °SEEK MEDICAL CARE IF: °· You have pus-like discharge from the breast. °· Your symptoms do not improve with  the treatment prescribed by your health care provider within 2 days. °SEEK IMMEDIATE MEDICAL CARE IF: °· Your pain and swelling are getting worse. °· You have pain that is not controlled with medicine. °· You have a red line extending from the breast toward your armpit. °· You have a fever or persistent symptoms for more than 2-3 days. °· You have a fever and your symptoms suddenly get worse. °MAKE SURE YOU:  °· Understand these instructions. °· Will watch your condition. °· Will get help right away if you are not doing well or get worse. °Document Released: 05/26/2004 Document Revised: 02/03/2013 Document Reviewed: 09/04/2012 °[employer]® Patient Information ©2015 [employer]. This information is not intended to replace advice given to you by your health care provider. Make sure you discuss any questions you have with your health care provider. °Breastfeeding °Deciding to breastfeed is one of the best choices you can make for you and your baby. A change in hormones during pregnancy causes your breast tissue to grow and increases the number and size of your milk ducts. These hormones also allow proteins, sugars, and fats from your blood supply to make breast milk in your milk-producing glands. Hormones prevent breast milk from being released before your baby is born as well as prompt milk flow after birth. Once breastfeeding has begun, thoughts of your baby, as well as his or her sucking or crying, can stimulate the release of milk from your milk-producing glands.  °BENEFITS OF BREASTFEEDING °For Your Baby °· Your first milk (colostrum) helps your baby's digestive system function better.   °· There are antibodies in your milk that help your baby fight off infections.   °· Your baby has a lower incidence of asthma, allergies, and sudden infant death syndrome.   °· The nutrients in breast milk are better for your baby than infant formulas and are designed uniquely for your baby's needs.   °· Breast milk improves your  baby's brain development.   °· Your baby is less likely to develop other conditions, such as childhood obesity, asthma, or type 2 diabetes mellitus.   °For You  °· Breastfeeding helps to create a very special bond between you and your baby.   °· Breastfeeding is convenient. Breast milk is always available at the correct temperature and costs nothing.   °· Breastfeeding helps to burn calories and helps you lose the weight gained during pregnancy.   °· Breastfeeding makes your uterus contract to its prepregnancy size faster and slows bleeding (lochia) after you give birth.   °· Breastfeeding helps to lower your risk of developing type 2 diabetes mellitus, osteoporosis, and breast or ovarian cancer later in life. °SIGNS THAT YOUR BABY IS HUNGRY °Early Signs of Hunger  °· Increased alertness or activity. °· Stretching. °· Movement of the head from side to side. °· Movement of the head and opening of the mouth when the corner of the mouth or cheek is stroked (rooting). °· Increased sucking sounds, smacking lips, cooing, sighing, or squeaking. °· Hand-to-mouth movements. °· Increased sucking of   fingers or hands. °Late Signs of Hunger °· Fussing. °· Intermittent crying. °Extreme Signs of Hunger °Signs of extreme hunger will require calming and consoling before your baby will be able to breastfeed successfully. Do not wait for the following signs of extreme hunger to occur before you initiate breastfeeding:   °· Restlessness. °· A loud, strong cry. °·  Screaming. °BREASTFEEDING BASICS °Breastfeeding Initiation °· Find a comfortable place to sit or lie down, with your neck and back well supported. °· Place a pillow or rolled up blanket under your baby to bring him or her to the level of your breast (if you are seated). Nursing pillows are specially designed to help support your arms and your baby while you breastfeed. °· Make sure that your baby's abdomen is facing your abdomen.   °· Gently massage your breast. With your  fingertips, massage from your chest wall toward your nipple in a circular motion. This encourages milk flow. You may need to continue this action during the feeding if your milk flows slowly. °· Support your breast with 4 fingers underneath and your thumb above your nipple. Make sure your fingers are well away from your nipple and your baby's mouth.   °· Stroke your baby's lips gently with your finger or nipple.   °· When your baby's mouth is open wide enough, quickly bring your baby to your breast, placing your entire nipple and as much of the colored area around your nipple (areola) as possible into your baby's mouth.   °¨ More areola should be visible above your baby's upper lip than below the lower lip.   °¨ Your baby's tongue should be between his or her lower gum and your breast.   °· Ensure that your baby's mouth is correctly positioned around your nipple (latched). Your baby's lips should create a seal on your breast and be turned out (everted). °· It is common for your baby to suck about 2-3 minutes in order to start the flow of breast milk. °Latching °Teaching your baby how to latch on to your breast properly is very important. An improper latch can cause nipple pain and decreased milk supply for you and poor weight gain in your baby. Also, if your baby is not latched onto your nipple properly, he or she may swallow some air during feeding. This can make your baby fussy. Burping your baby when you switch breasts during the feeding can help to get rid of the air. However, teaching your baby to latch on properly is still the best way to prevent fussiness from swallowing air while breastfeeding. °Signs that your baby has successfully latched on to your nipple:    °· Silent tugging or silent sucking, without causing you pain.   °· Swallowing heard between every 3-4 sucks.   °·  Muscle movement above and in front of his or her ears while sucking.   °Signs that your baby has not successfully latched on to  nipple:  °· Sucking sounds or smacking sounds from your baby while breastfeeding. °· Nipple pain. °If you think your baby has not latched on correctly, slip your finger into the corner of your baby's mouth to break the suction and place it between your baby's gums. Attempt breastfeeding initiation again. °Signs of Successful Breastfeeding °Signs from your baby:   °· A gradual decrease in the number of sucks or complete cessation of sucking.   °· Falling asleep.   °· Relaxation of his or her body.   °· Retention of a small amount of milk in his or her mouth.   °· Letting go   of your breast by himself or herself. °Signs from you: °· Breasts that have increased in firmness, weight, and size 1-3 hours after feeding.   °· Breasts that are softer immediately after breastfeeding. °· Increased milk volume, as well as a change in milk consistency and color by the fifth day of breastfeeding.   °· Nipples that are not sore, cracked, or bleeding. °Signs That Your Baby is Getting Enough Milk °· Wetting at least 3 diapers in a 24-hour period. The urine should be clear and pale yellow by age 5 days. °· At least 3 stools in a 24-hour period by age 5 days. The stool should be soft and yellow. °· At least 3 stools in a 24-hour period by age 7 days. The stool should be seedy and yellow. °· No loss of weight greater than 10% of birth weight during the first 3 days of age. °· Average weight gain of 4-7 ounces (113-198 g) per week after age 4 days. °· Consistent daily weight gain by age 5 days, without weight loss after the age of 2 weeks. °After a feeding, your baby may spit up a small amount. This is common. °BREASTFEEDING FREQUENCY AND DURATION °Frequent feeding will help you make more milk and can prevent sore nipples and breast engorgement. Breastfeed when you feel the need to reduce the fullness of your breasts or when your baby shows signs of hunger. This is called "breastfeeding on demand." Avoid introducing a pacifier to your  baby while you are working to establish breastfeeding (the first 4-6 weeks after your baby is born). After this time you may choose to use a pacifier. Research has shown that pacifier use during the first year of a baby's life decreases the risk of sudden infant death syndrome (SIDS). °Allow your baby to feed on each breast as long as he or she wants. Breastfeed until your baby is finished feeding. When your baby unlatches or falls asleep while feeding from the first breast, offer the second breast. Because newborns are often sleepy in the first few weeks of life, you may need to awaken your baby to get him or her to feed. °Breastfeeding times will vary from baby to baby. However, the following rules can serve as a guide to help you ensure that your baby is properly fed: °· Newborns (babies 4 weeks of age or younger) may breastfeed every 1-3 hours. °· Newborns should not go longer than 3 hours during the day or 5 hours during the night without breastfeeding. °· You should breastfeed your baby a minimum of 8 times in a 24-hour period until you begin to introduce solid foods to your baby at around 6 months of age. °BREAST MILK PUMPING °Pumping and storing breast milk allows you to ensure that your baby is exclusively fed your breast milk, even at times when you are unable to breastfeed. This is especially important if you are going back to work while you are still breastfeeding or when you are not able to be present during feedings. Your lactation consultant can give you guidelines on how long it is safe to store breast milk.  °A breast pump is a machine that allows you to pump milk from your breast into a sterile bottle. The pumped breast milk can then be stored in a refrigerator or freezer. Some breast pumps are operated by hand, while others use electricity. Ask your lactation consultant which type will work best for you. Breast pumps can be purchased, but some hospitals and breastfeeding support groups   lease  breast pumps on a monthly basis. A lactation consultant can teach you how to hand express breast milk, if you prefer not to use a pump.  °CARING FOR YOUR BREASTS WHILE YOU BREASTFEED °Nipples can become dry, cracked, and sore while breastfeeding. The following recommendations can help keep your breasts moisturized and healthy: °· Avoid using soap on your nipples.   °· Wear a supportive bra. Although not required, special nursing bras and tank tops are designed to allow access to your breasts for breastfeeding without taking off your entire bra or top. Avoid wearing underwire-style bras or extremely tight bras. °· Air dry your nipples for 3-4 minutes after each feeding.   °· Use only cotton bra pads to absorb leaked breast milk. Leaking of breast milk between feedings is normal.   °· Use lanolin on your nipples after breastfeeding. Lanolin helps to maintain your skin's normal moisture barrier. If you use pure lanolin, you do not need to wash it off before feeding your baby again. Pure lanolin is not toxic to your baby. You may also hand express a few drops of breast milk and gently massage that milk into your nipples and allow the milk to air dry. °In the first few weeks after giving birth, some women experience extremely full breasts (engorgement). Engorgement can make your breasts feel heavy, warm, and tender to the touch. Engorgement peaks within 3-5 days after you give birth. The following recommendations can help ease engorgement: °· Completely empty your breasts while breastfeeding or pumping. You may want to start by applying warm, moist heat (in the shower or with warm water-soaked hand towels) just before feeding or pumping. This increases circulation and helps the milk flow. If your baby does not completely empty your breasts while breastfeeding, pump any extra milk after he or she is finished. °· Wear a snug bra (nursing or regular) or tank top for 1-2 days to signal your body to slightly decrease milk  production. °· Apply ice packs to your breasts, unless this is too uncomfortable for you. °· Make sure that your baby is latched on and positioned properly while breastfeeding. °If engorgement persists after 48 hours of following these recommendations, contact your health care provider or a lactation consultant. °OVERALL HEALTH CARE RECOMMENDATIONS WHILE BREASTFEEDING °· Eat healthy foods. Alternate between meals and snacks, eating 3 of each per day. Because what you eat affects your breast milk, some of the foods may make your baby more irritable than usual. Avoid eating these foods if you are sure that they are negatively affecting your baby. °· Drink milk, fruit juice, and water to satisfy your thirst (about 10 glasses a day).   °· Rest often, relax, and continue to take your prenatal vitamins to prevent fatigue, stress, and anemia. °· Continue breast self-awareness checks. °· Avoid chewing and smoking tobacco. °· Avoid alcohol and drug use. °Some medicines that may be harmful to your baby can pass through breast milk. It is important to ask your health care provider before taking any medicine, including all over-the-counter and prescription medicine as well as vitamin and herbal supplements. °It is possible to become pregnant while breastfeeding. If birth control is desired, ask your health care provider about options that will be safe for your baby. °SEEK MEDICAL CARE IF:  °· You feel like you want to stop breastfeeding or have become frustrated with breastfeeding. °· You have painful breasts or nipples. °· Your nipples are cracked or bleeding. °· Your breasts are red, tender, or warm. °· You have   a swollen area on either breast. °· You have a fever or chills. °· You have nausea or vomiting. °· You have drainage other than breast milk from your nipples. °· Your breasts do not become full before feedings by the fifth day after you give birth. °· You feel sad and depressed. °· Your baby is too sleepy to eat  well. °· Your baby is having trouble sleeping.   °· Your baby is wetting less than 3 diapers in a 24-hour period. °· Your baby has less than 3 stools in a 24-hour period. °· Your baby's skin or the white part of his or her eyes becomes yellow.   °· Your baby is not gaining weight by 5 days of age. °SEEK IMMEDIATE MEDICAL CARE IF:  °· Your baby is overly tired (lethargic) and does not want to wake up and feed. °· Your baby develops an unexplained fever. °Document Released: 01/29/2005 Document Revised: 02/03/2013 Document Reviewed: 07/23/2012 °ExitCare® Patient Information ©2015 [employer]. This information is not intended to replace advice given to you by your health care provider. Make sure you discuss any questions you have with your health care provider. ° °

## 2014-01-08 NOTE — Lactation Note (Signed)
This note was copied from the chart of Jill Tacey Heaplisha Magaw. Lactation Consultation Note  Mother's nipples have abrasions on tips. Baby has tight suck.   Mother placed baby in football hold.  Demonstrated how to achieve a deeper latch and do a chin tug. Encouraged mother to change positions often to reduce soreness. Sucks and some swallows observed. Reviewed applying ebm and comfort gels. Mother is [employer], [employer] employee.  Provided her with UMR pump. Reviewed engorgement care and monitoring voids/stools.  Patient Name: Jill Camacho BJYNW'GToday's Date: 01/08/2014 Reason for consult: Follow-up assessment   Maternal Data    Feeding    LATCH Score/Interventions Latch: Grasps breast easily, tongue down, lips flanged, rhythmical sucking.  Audible Swallowing: A few with stimulation  Type of Nipple: Everted at rest and after stimulation  Comfort (Breast/Nipple): Filling, red/small blisters or bruises, mild/mod discomfort  Problem noted: Mild/Moderate discomfort Interventions (Mild/moderate discomfort): Hand expression;Comfort gels  Hold (Positioning): No assistance needed to correctly position infant at breast.  LATCH Score: 8  Lactation Tools Discussed/Used     Consult Status Consult Status: Complete    Hardie PulleyBerkelhammer, Ruth Boschen 01/08/2014, 10:38 AM

## 2014-01-08 NOTE — Plan of Care (Signed)
Problem: Phase I Progression Outcomes Goal: Pain controlled with appropriate interventions Outcome: Completed/Met Date Met:  01/08/14 Goal: IS, TCDB as ordered Outcome: Not Applicable Date Met:  95/70/22 Goal: Other Phase I Outcomes/Goals Outcome: Completed/Met Date Met:  01/08/14

## 2014-01-08 NOTE — Discharge Summary (Signed)
Obstetric Discharge Summary  Reason for Admission: Pt is a [id number] at 1570w3d presenting in active labor.  [facility], with Dr. Ernestina PennaFogleman as primary provider.  Medications on Admission: Prescriptions prior to admission  Medication Sig Dispense Refill Last Dose  . calcium carbonate (TUMS - DOSED IN MG ELEMENTAL CALCIUM) 500 MG chewable tablet Chew 1 tablet by mouth 2 (two) times daily as needed. For heartburn   01/07/2014 at Unknown time  . Docosahexaenoic Acid (DHA OMEGA 3 PO) Take 1 capsule by mouth daily.   01/06/2014 at Unknown time  . folic acid (FOLVITE) 800 MCG tablet Take 800 mcg by mouth daily.   01/06/2014 at Unknown time  . Prenatal Vit-Fe Fumarate-FA (PRENATAL MULTIVITAMIN) TABS Take 1 tablet by mouth daily.   01/06/2014 at Unknown time    Prenatal Labs: ABO, Rh: O NEG (11/26 0735) / Rhophylac infant Rh Negative Antibody: NEG (11/26 0735) Rubella:  Immune  RPR: NON REAC (11/26 0735)  HBsAg:  NON REAC  HIV:  NON REAC GTT : Normal GBS:  Negative  Prenatal Procedures: NST and ultrasound Intrapartum Course: Admitted in active labor / rapid progression to complete dilation / SVD of viable female with 2nd degree repair by Dr. Cherly Hensenousins / no immediate postpartum complications noted Intrapartum Procedures: spontaneous vaginal delivery Postpartum Procedures: none Complications-Operative and Postpartum: 2nd degree perineal laceration  Labs: HEMOGLOBIN  Date Value Ref Range Status  01/08/2014 10.4* 12.0 - 15.0 g/dL Final    Comment:    DELTA CHECK NOTED REPEATED TO VERIFY    HCT  Date Value Ref Range Status  01/08/2014 30.6* 36.0 - 46.0 % Final   Lab Results  Component Value Date   PLT 216 01/08/2014    Newborn Data: Live born female  Birth Weight: 8 lb 4.3 oz (3751 g) APGAR: 9, 9  Home with mother.   Discharge Information: Date: 01/08/2014 Discharge Diagnoses:  Pt is a [id number] at 3270w3d S/P Term  Pregnancy-delivered on 01/07/2014  Condition: stable Activity: pelvic rest Diet: routine Medications:    Medication List    STOP taking these medications        iron polysaccharides 150 MG capsule  Commonly known as:  NU-IRON      TAKE these medications        calcium carbonate 500 MG chewable tablet  Commonly known as:  TUMS - dosed in mg elemental calcium  Chew 1 tablet by mouth 2 (two) times daily as needed. For heartburn     DHA OMEGA 3 PO  Take 1 capsule by mouth daily.     folic acid 800 MCG tablet  Commonly known as:  FOLVITE  Take 800 mcg by mouth daily.     ibuprofen 600 MG tablet  Commonly known as:  ADVIL,MOTRIN  Take 1 tablet (600 mg total) by mouth every 6 (six) hours.     prenatal multivitamin Tabs tablet  Take 1 tablet by mouth daily.       Instructions: [facility] been given and reviewed Discharge to: home     Follow-up Information    [facility] Schedule an appointment as soon as possible for a visit in 6 weeks.   Specialty:  Obstetrics and Gynecology   Why:  postpartum visit   Contact information:   [address] [place] [id number] [phone]       Raelyn MoraAWSON, Shekera Beavers, Judie PetitM, MSN, CNM 01/08/2014, 8:36 AM

## 2014-01-08 NOTE — Progress Notes (Signed)
Patient ID: Jill Camacho, female   DOB: [birth year], 33 y.o.   MRN: [id number] PPD # 1 SVD  S:  Reports feeling well, desires an early discharge             Tolerating po/ No nausea or vomiting             Bleeding is light             Pain controlled with ibuprofen (OTC)             Up ad lib / ambulatory / voiding without difficulties    Newborn  Information for the patient's newborn:  [name], [name] [[id number]][[id number]]  female  breast feeding     O:  A & O x 3, in no apparent distress              VS:  Filed Vitals:   01/07/14 1130 01/07/14 1653 01/07/14 2300 01/08/14 0544  BP: 124/64 120/76 127/60 103/54  Pulse: 81 90 81 62  Temp:  98.2 F (36.8 C) 98.4 F (36.9 C) 98.2 F (36.8 C)  TempSrc:  Oral Oral Tympanic  Resp: 18 18 20 17   Height:      Weight:      SpO2:    100%    LABS:  Recent Labs  01/07/14 0735 01/08/14 0535  WBC 16.7* 15.2*  HGB 14.1 10.4*  HCT 39.1 30.6*  PLT 227 216    Blood type: --/--/O NEG (11/26 0735)  Rubella:     I&O: I/O last 3 completed shifts: In: -  Out: 600 [Blood:600]             Lungs: Clear and unlabored  Heart: regular rate and rhythm / no murmurs  Abdomen: soft, non-tender, non-distended              Fundus: firm, non-tender, U-1  Perineum: 2nd degree repair healing well, no edema  Lochia: minimal  Extremities: no edema, no calf pain or tenderness, no Homans    A/P: PPD # 1  33 y.o., [id number]   Principal Problem:    Postpartum care following vaginal delivery (11/26)  Active Problems:    Active labor   Doing well - stable status  Routine post partum orders  Early D/C home today   Raelyn MoraAWSON, Denissa Cozart, M, MSN, CNM 01/08/2014, 8:32 AM

## 2014-01-08 NOTE — Plan of Care (Signed)
Problem: Discharge Progression Outcomes Goal: Activity appropriate for discharge plan Outcome: Adequate for Discharge Goal: Tolerating diet Outcome: Completed/Met Date Met:  01/08/14 Goal: Pain controlled with appropriate interventions Outcome: Completed/Met Date Met:  01/08/14 Goal: Afebrile, VS remain stable at discharge Outcome: Completed/Met Date Met:  01/08/14 Goal: Remove staples per MD order Outcome: Not Applicable Date Met:  01/30/74 Goal: MMR given as ordered Outcome: Not Applicable Date Met:  88/32/54 Goal: Discharge plan in place and appropriate Outcome: Completed/Met Date Met:  01/08/14

## 2014-01-13 ENCOUNTER — Inpatient Hospital Stay (HOSPITAL_COMMUNITY): Admission: RE | Admit: 2014-01-13 | Payer: 59 | Source: Ambulatory Visit

## 2015-05-11 MED FILL — TRIAMCINOLONE 0.1% OINTMENT: 0.1 | 7 days supply | Qty: 30 | Fill #0

## 2015-06-02 ENCOUNTER — Ambulatory Visit (HOSPITAL_COMMUNITY)
Admission: EM | Admit: 2015-06-02 | Discharge: 2015-06-02 | Disposition: A | Discharge status: Home / Self Care | Payer: 59 | Attending: Family Medicine | Admitting: Family Medicine

## 2015-06-02 ENCOUNTER — Encounter (HOSPITAL_COMMUNITY): Payer: Self-pay | Admitting: Emergency Medicine

## 2015-06-02 DIAGNOSIS — J020 Streptococcal pharyngitis: Secondary | ICD-10-CM | POA: Diagnosis not present

## 2015-06-02 LAB — POCT RAPID STREP A: Streptococcus, Group A Screen (Direct): POSITIVE — AB

## 2015-06-02 MED ORDER — AZITHROMYCIN 250 MG PO TABS: ORAL_TABLET | ORAL | Status: AC

## 2015-06-02 MED ORDER — LIDOCAINE VISCOUS 2 % MT SOLN: 20.0000 mL | OROMUCOSAL | Status: AC | PRN

## 2015-06-02 NOTE — ED Provider Notes (Signed)
CSN: [id number]     Arrival date & time 06/02/15  1704 History   First MD Initiated Contact with Patient 06/02/15 1823     No chief complaint on file.  (Consider location/radiation/quality/duration/timing/severity/associated sxs/prior Treatment) Patient is a 35 y.o. female presenting with pharyngitis. The history is provided by the patient.  Sore Throat This is a new problem. The current episode started 1 to 2 hours ago. The problem occurs constantly. The problem has been gradually worsening. Nothing aggravates the symptoms. The symptoms are relieved by NSAIDs and acetaminophen. She has tried acetaminophen for the symptoms.    Past Medical History  Diagnosis Date  . No pertinent past medical history   . Acute blood loss anemia 10/24/2011  . Postpartum care following vaginal delivery (11/26) 01/07/2014   Past Surgical History  Procedure Laterality Date  . Leep    . Wisdom tooth extraction     Family History  Problem Relation Age of Onset  . Heart disease Maternal Grandmother   . Cancer Maternal Grandmother   . Heart disease Maternal Grandfather   . Cancer Maternal Grandfather   . Cancer Paternal Grandmother   . Cancer Paternal Grandfather    Social History  Substance Use Topics  . Smoking status: Never Smoker   . Smokeless tobacco: Never Used  . Alcohol Use: No   OB History    Gravida Para Term Preterm AB TAB SAB Ectopic Multiple Living   0 2     Review of Systems  Constitutional: Positive for fever and fatigue.  HENT: Positive for sore throat and trouble swallowing.   Respiratory: Negative.   Cardiovascular: Negative.   Endocrine: Negative.   Genitourinary: Negative.   Musculoskeletal: Negative.   Skin: Negative.   Allergic/Immunologic: Negative.   Neurological: Negative.   Hematological: Negative.   Psychiatric/Behavioral: Negative.     Allergies  Penicillins  Home Medications   Prior to Admission medications   Medication Sig Start Date End  Date Taking? Authorizing Provider  calcium carbonate (TUMS - DOSED IN MG ELEMENTAL CALCIUM) 500 MG chewable tablet Chew 1 tablet by mouth 2 (two) times daily as needed. For heartburn    Historical Provider, MD  Docosahexaenoic Acid (DHA OMEGA 3 PO) Take 1 capsule by mouth daily.    Historical Provider, MD  folic acid (FOLVITE) 800 MCG tablet Take 800 mcg by mouth daily.    Historical Provider, MD  ibuprofen (ADVIL,MOTRIN) 600 MG tablet Take 1 tablet (600 mg total) by mouth every 6 (six) hours. 01/08/14   Raelyn Mora, CNM  Prenatal Vit-Fe Fumarate-FA (PRENATAL MULTIVITAMIN) TABS Take 1 tablet by mouth daily.    Historical Provider, MD   Meds Ordered and Administered this Visit  Medications - No data to display  BP 129/91 mmHg  Pulse 96  Temp(Src) 99.9 F (37.7 C) (Oral)  Resp 14  SpO2 100% No data found.   Physical Exam  Constitutional: She is oriented to person, place, and time. She appears well-developed and well-nourished.  HENT:  Head: Normocephalic and atraumatic.  Right Ear: External ear normal.  Left Ear: External ear normal.  Mouth/Throat: Oropharyngeal exudate present.  Opx - Erythematous and with petichiae on palate.  Tonsils 2 plus w/o exudates.  Cardiovascular: Normal rate, regular rhythm and normal heart sounds.   Pulmonary/Chest: Effort normal and breath sounds normal.  Abdominal: Soft. Bowel sounds are normal.  Musculoskeletal: Normal range of motion.  Lymphadenopathy:    She has cervical adenopathy.  Neurological: She is alert and oriented to person, place, and time.    ED Course  Procedures (including critical care time)  Labs Review Labs Reviewed  POCT RAPID STREP A - Abnormal; Notable for the following:    Streptococcus, Group A Screen (Direct) POSITIVE (*)    All other components within normal limits    Imaging Review No results found.   Visual Acuity Review  Right Eye Distance:   Left Eye Distance:   Bilateral Distance:    Right Eye  Near:   Left Eye Near:    Bilateral Near:         MDM  Strep Pharyngitis Zpak as directed Lidocaine viscous 20ml gargle q 4 hours prn #1600ml Push po fluids, rest, tylenol and motrin otc prn as directed for fever, arthralgias, and myalgias.  Follow up prn if sx's continue or persist.    Deatra CanterWilliam J Eriel Doyon, FNP 06/02/15 [phone]

## 2015-06-02 NOTE — Discharge Instructions (Signed)
Laryngitis  Laryngitis is swelling (inflammation) of your vocal cords. This causes hoarseness, coughing, loss of voice, sore throat, or a dry throat. When your vocal cords are inflamed, your voice sounds different.  Laryngitis can be temporary (acute) or long-term (chronic). Most cases of acute laryngitis improve with time. Chronic laryngitis is laryngitis that lasts for more than three weeks.  HOME CARE  · Drink enough fluid to keep your pee (urine) clear or pale yellow.  · Breathe in moist air. Use a humidifier if you live in a dry climate.  · Take medicines only as told by your doctor.  · Do not smoke cigarettes or electronic cigarettes. If you need help quitting, ask your doctor.  · Talk as little as possible. Also avoid whispering, which can cause vocal strain.  · Write instead of talking. Do this until your voice is back to normal.  GET HELP IF:  · You have a fever.  · Your pain is worse.  · You have trouble swallowing.  GET HELP RIGHT AWAY IF:  · You cough up blood.  · You have trouble breathing.     This information is not intended to replace advice given to you by your health care provider. Make sure you discuss any questions you have with your health care provider.     Document Released: 01/18/2011 Document Revised: 02/19/2014 Document Reviewed: 07/14/2013  Elsevier Interactive Patient Education ©2016 [employer]

## 2015-06-02 NOTE — ED Notes (Signed)
PT reports sore throat for two days with low grade fevers. PT took 600 mg of ibuprofen at 3pm.

## 2015-07-28 ENCOUNTER — Other Ambulatory Visit: Payer: Self-pay | Admitting: Internal Medicine

## 2015-07-28 MED ORDER — HYDROCODONE-HOMATROPINE 5-1.5 MG/5ML PO SYRP: 5.0000 mL | ORAL_SOLUTION | Freq: Four times a day (QID) | ORAL | Status: AC | PRN

## 2015-07-28 MED FILL — HYDROCODONE-HOMATROPINE SYR: 5-1.5 | 6 days supply | Qty: 120 | Fill #0

## 2015-08-02 MED FILL — DUREZOL 0.05% EYE DROPS: 0.05 | 17 days supply | Qty: 5 | Fill #0

## 2015-08-02 MED FILL — GATIFLOXACIN 0.5% EYE DROPS: 0.5 | 17 days supply | Qty: 3 | Fill #0

## 2015-08-05 MED FILL — ZOLPIDEM TARTRATE 10 MG TAB: 10 | 10 days supply | Qty: 10 | Fill #0

## 2015-08-23 DIAGNOSIS — Z6823 Body mass index (BMI) 23.0-23.9, adult: Secondary | ICD-10-CM | POA: Diagnosis not present

## 2015-08-23 DIAGNOSIS — Z01419 Encounter for gynecological examination (general) (routine) without abnormal findings: Secondary | ICD-10-CM | POA: Diagnosis not present

## 2015-08-23 MED FILL — MONO-LINYAH 28 TABLET: 0.25-35 | 28 days supply | Qty: 28 | Fill #0

## 2015-10-19 DIAGNOSIS — Z0000 Encounter for general adult medical examination without abnormal findings: Secondary | ICD-10-CM | POA: Diagnosis not present

## 2015-10-19 DIAGNOSIS — J309 Allergic rhinitis, unspecified: Secondary | ICD-10-CM | POA: Diagnosis not present

## 2016-02-16 ENCOUNTER — Ambulatory Visit: Payer: 59 | Admitting: Podiatry

## 2016-04-17 DIAGNOSIS — N943 Premenstrual tension syndrome: Secondary | ICD-10-CM | POA: Diagnosis not present

## 2016-04-17 MED FILL — VENLAFAXINE HCL ER 37.5 MG: 37.5 | 30 days supply | Qty: 30 | Fill #0

## 2016-05-09 MED FILL — VENLAFAXINE HCL ER 75 MG CA: 75 | 30 days supply | Qty: 30 | Fill #0

## 2016-05-21 DIAGNOSIS — R42 Dizziness and giddiness: Secondary | ICD-10-CM | POA: Diagnosis not present

## 2016-06-12 MED FILL — VENLAFAXINE HCL ER 75 MG CA: 75 | 30 days supply | Qty: 30 | Fill #0

## 2016-07-19 MED FILL — VENLAFAXINE HCL ER 75 MG CA: 75 | 30 days supply | Qty: 30 | Fill #1

## 2016-09-04 DIAGNOSIS — H00012 Hordeolum externum right lower eyelid: Secondary | ICD-10-CM | POA: Diagnosis not present

## 2016-09-04 DIAGNOSIS — H00011 Hordeolum externum right upper eyelid: Secondary | ICD-10-CM | POA: Diagnosis not present

## 2016-09-04 MED FILL — OFLOXACIN 0.3% EYE DROPS: 0.3 | 19 days supply | Qty: 10 | Fill #0

## 2016-10-18 MED FILL — VENLAFAXINE HCL ER 75 MG CA: 75 | 30 days supply | Qty: 30 | Fill #2

## 2016-12-03 MED FILL — VENLAFAXINE HCL ER 75 MG CA: 75 | 30 days supply | Qty: 30 | Fill #1

## 2016-12-21 DIAGNOSIS — Z01419 Encounter for gynecological examination (general) (routine) without abnormal findings: Secondary | ICD-10-CM | POA: Diagnosis not present

## 2016-12-21 DIAGNOSIS — Z6823 Body mass index (BMI) 23.0-23.9, adult: Secondary | ICD-10-CM | POA: Diagnosis not present

## 2016-12-28 DIAGNOSIS — Z0000 Encounter for general adult medical examination without abnormal findings: Secondary | ICD-10-CM | POA: Diagnosis not present

## 2016-12-28 DIAGNOSIS — Z131 Encounter for screening for diabetes mellitus: Secondary | ICD-10-CM | POA: Diagnosis not present

## 2016-12-28 DIAGNOSIS — J309 Allergic rhinitis, unspecified: Secondary | ICD-10-CM | POA: Diagnosis not present

## 2016-12-28 DIAGNOSIS — Z13220 Encounter for screening for lipoid disorders: Secondary | ICD-10-CM | POA: Diagnosis not present

## 2017-01-25 DIAGNOSIS — H5203 Hypermetropia, bilateral: Secondary | ICD-10-CM | POA: Diagnosis not present

## 2017-03-22 MED FILL — VENLAFAXINE HCL ER 75 MG CA: 75 | 90 days supply | Qty: 90 | Fill #0

## 2017-05-27 DIAGNOSIS — H0012 Chalazion right lower eyelid: Secondary | ICD-10-CM | POA: Diagnosis not present

## 2017-05-27 DIAGNOSIS — H0011 Chalazion right upper eyelid: Secondary | ICD-10-CM | POA: Diagnosis not present

## 2017-06-21 DIAGNOSIS — H0011 Chalazion right upper eyelid: Secondary | ICD-10-CM | POA: Diagnosis not present

## 2017-06-21 DIAGNOSIS — H0012 Chalazion right lower eyelid: Secondary | ICD-10-CM | POA: Diagnosis not present

## 2017-08-26 DIAGNOSIS — H0012 Chalazion right lower eyelid: Secondary | ICD-10-CM | POA: Diagnosis not present

## 2017-08-26 DIAGNOSIS — H0100A Unspecified blepharitis right eye, upper and lower eyelids: Secondary | ICD-10-CM | POA: Diagnosis not present

## 2017-08-26 MED FILL — NEO/POLYMYXIN/DEXAMETH DROP: 3.5-10000-0 | 25 days supply | Qty: 5 | Fill #0

## 2018-01-20 DIAGNOSIS — N879 Dysplasia of cervix uteri, unspecified: Secondary | ICD-10-CM | POA: Diagnosis not present

## 2018-01-20 DIAGNOSIS — Z131 Encounter for screening for diabetes mellitus: Secondary | ICD-10-CM | POA: Diagnosis not present

## 2018-01-20 DIAGNOSIS — Z136 Encounter for screening for cardiovascular disorders: Secondary | ICD-10-CM | POA: Diagnosis not present

## 2018-01-20 DIAGNOSIS — Z0000 Encounter for general adult medical examination without abnormal findings: Secondary | ICD-10-CM | POA: Diagnosis not present

## 2018-01-20 DIAGNOSIS — J309 Allergic rhinitis, unspecified: Secondary | ICD-10-CM | POA: Diagnosis not present

## 2018-02-10 DIAGNOSIS — H5201 Hypermetropia, right eye: Secondary | ICD-10-CM | POA: Diagnosis not present

## 2019-10-02 ENCOUNTER — Encounter (HOSPITAL_COMMUNITY): Payer: Self-pay | Admitting: Emergency Medicine

## 2019-10-02 ENCOUNTER — Emergency Department (HOSPITAL_COMMUNITY): Payer: No Typology Code available for payment source

## 2019-10-02 ENCOUNTER — Emergency Department (HOSPITAL_COMMUNITY)
Admission: EM | Admit: 2019-10-02 | Discharge: 2019-10-02 | Disposition: A | Discharge status: Home / Self Care | Payer: No Typology Code available for payment source | Attending: Emergency Medicine | Admitting: Emergency Medicine

## 2019-10-02 ENCOUNTER — Other Ambulatory Visit: Payer: Self-pay

## 2019-10-02 DIAGNOSIS — R519 Headache, unspecified: Secondary | ICD-10-CM | POA: Diagnosis not present

## 2019-10-02 DIAGNOSIS — H539 Unspecified visual disturbance: Secondary | ICD-10-CM | POA: Insufficient documentation

## 2019-10-02 DIAGNOSIS — Z79899 Other long term (current) drug therapy: Secondary | ICD-10-CM | POA: Insufficient documentation

## 2019-10-02 DIAGNOSIS — H547 Unspecified visual loss: Secondary | ICD-10-CM | POA: Insufficient documentation

## 2019-10-02 LAB — COMPREHENSIVE METABOLIC PANEL
ALT: 20 U/L (ref 0–44)
AST: 20 U/L (ref 15–41)
Albumin: 4.2 g/dL (ref 3.5–5.0)
Alkaline Phosphatase: 75 U/L (ref 38–126)
Anion gap: 8 (ref 5–15)
BUN: 10 mg/dL (ref 6–20)
CO2: 26 mmol/L (ref 22–32)
Calcium: 9.6 mg/dL (ref 8.9–10.3)
Chloride: 103 mmol/L (ref 98–111)
Creatinine, Ser: 0.84 mg/dL (ref 0.44–1.00)
GFR calc Af Amer: >60 mL/min (ref >60)
GFR calc non Af Amer: >60 mL/min (ref >60)
Glucose, Bld: 102 mg/dL — ABNORMAL HIGH (ref 70–99)
Potassium: 3.9 mmol/L (ref 3.5–5.1)
Sodium: 137 mmol/L (ref 135–145)
Total Bilirubin: 0.5 mg/dL (ref 0.3–1.2)
Total Protein: 7.2 g/dL (ref 6.5–8.1)

## 2019-10-02 LAB — I-STAT CHEM 8, ED
BUN: 12 mg/dL (ref 6–20)
Calcium, Ion: 1.18 mmol/L (ref 1.15–1.40)
Chloride: 103 mmol/L (ref 98–111)
Creatinine, Ser: 0.8 mg/dL (ref 0.44–1.00)
Glucose, Bld: 100 mg/dL — ABNORMAL HIGH (ref 70–99)
HCT: 39 % (ref 36.0–46.0)
Hemoglobin: 13.3 g/dL (ref 12.0–15.0)
Potassium: 3.8 mmol/L (ref 3.5–5.1)
Sodium: 139 mmol/L (ref 135–145)
TCO2: 25 mmol/L (ref 22–32)

## 2019-10-02 LAB — DIFFERENTIAL
Abs Immature Granulocytes: 0.04 10*3/uL (ref 0.00–0.07)
Basophils Absolute: 0.1 10*3/uL (ref 0.0–0.1)
Basophils Relative: 1 %
Eosinophils Absolute: 0.1 10*3/uL (ref 0.0–0.5)
Eosinophils Relative: 1 %
Immature Granulocytes: 0 %
Lymphocytes Relative: 26 %
Lymphs Abs: 2.8 10*3/uL (ref 0.7–4.0)
Monocytes Absolute: 0.7 10*3/uL (ref 0.1–1.0)
Monocytes Relative: 6 %
Neutro Abs: 7.2 10*3/uL (ref 1.7–7.7)
Neutrophils Relative %: 66 %

## 2019-10-02 LAB — CBC
HCT: 39.7 % (ref 36.0–46.0)
Hemoglobin: 12.9 g/dL (ref 12.0–15.0)
MCH: 30.1 pg (ref 26.0–34.0)
MCHC: 32.5 g/dL (ref 30.0–36.0)
MCV: 92.8 fL (ref 80.0–100.0)
Platelets: 270 10*3/uL (ref 150–400)
RBC: 4.28 MIL/uL (ref 3.87–5.11)
RDW: 12.4 % (ref 11.5–15.5)
WBC: 10.9 10*3/uL — ABNORMAL HIGH (ref 4.0–10.5)
nRBC: 0 % (ref 0.0–0.2)

## 2019-10-02 LAB — I-STAT BETA HCG BLOOD, ED (MC, WL, AP ONLY): I-stat hCG, quantitative: <5 m[IU]/mL (ref <5)

## 2019-10-02 LAB — PROTIME-INR
INR: 1 (ref 0.8–1.2)
Prothrombin Time: 12.7 seconds (ref 11.4–15.2)

## 2019-10-02 LAB — APTT: aPTT: 27 seconds (ref 24–36)

## 2019-10-02 MED ORDER — METOCLOPRAMIDE HCL 10 MG PO TABS: 10.0000 mg | ORAL_TABLET | Freq: Four times a day (QID) | ORAL | 0 refills | Status: AC | PRN

## 2019-10-02 MED ORDER — METOCLOPRAMIDE HCL 5 MG/ML IJ SOLN
10.0000 mg | Freq: Once | INTRAMUSCULAR | Status: DC
  Filled 2019-10-02: qty 2

## 2019-10-02 MED ORDER — DIPHENHYDRAMINE HCL 12.5 MG/5ML PO ELIX
6.2500 mg | ORAL_SOLUTION | Freq: Once | ORAL | Status: AC
  Administered 2019-10-02: 6.25 mg via ORAL
  Filled 2019-10-02: qty 10

## 2019-10-02 MED ORDER — SODIUM CHLORIDE 0.9% FLUSH: 3.0000 mL | Freq: Once | INTRAVENOUS | Status: DC

## 2019-10-02 MED ORDER — KETOROLAC TROMETHAMINE 30 MG/ML IJ SOLN
30.0000 mg | Freq: Once | INTRAMUSCULAR | Status: AC
  Administered 2019-10-02: 30 mg via INTRAVENOUS
  Filled 2019-10-02: qty 1

## 2019-10-02 NOTE — ED Triage Notes (Addendum)
Per pt she was at her desk and she noticed the screen went like a shattered glass look to her in her left eye. Pt said she does have a slight headache. Pt also said she hit herself the head with a paddle board about 1 week ago and does have some jaw alignment issues. Pt said the ground glass reflection has moved up in her eye.
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
## 2020-03-02 ENCOUNTER — Ambulatory Visit: Payer: No Typology Code available for payment source

## 2020-03-10 DIAGNOSIS — Z6824 Body mass index (BMI) 24.0-24.9, adult: Secondary | ICD-10-CM | POA: Diagnosis not present

## 2020-03-10 DIAGNOSIS — Z01419 Encounter for gynecological examination (general) (routine) without abnormal findings: Secondary | ICD-10-CM | POA: Diagnosis not present

## 2020-03-11 DIAGNOSIS — D649 Anemia, unspecified: Secondary | ICD-10-CM | POA: Diagnosis not present

## 2020-03-11 DIAGNOSIS — Z131 Encounter for screening for diabetes mellitus: Secondary | ICD-10-CM | POA: Diagnosis not present

## 2020-03-11 DIAGNOSIS — Z13220 Encounter for screening for lipoid disorders: Secondary | ICD-10-CM | POA: Diagnosis not present

## 2020-03-11 DIAGNOSIS — Z0000 Encounter for general adult medical examination without abnormal findings: Secondary | ICD-10-CM | POA: Diagnosis not present

## 2020-05-11 DIAGNOSIS — Z1231 Encounter for screening mammogram for malignant neoplasm of breast: Secondary | ICD-10-CM | POA: Diagnosis not present

## 2020-05-22 DIAGNOSIS — H00025 Hordeolum internum left lower eyelid: Secondary | ICD-10-CM | POA: Diagnosis not present

## 2020-10-02 ENCOUNTER — Ambulatory Visit (HOSPITAL_COMMUNITY)
Admission: EM | Admit: 2020-10-02 | Discharge: 2020-10-02 | Disposition: A | Discharge status: Home / Self Care | Payer: BC Managed Care – PPO | Attending: Emergency Medicine | Admitting: Emergency Medicine

## 2020-10-02 ENCOUNTER — Other Ambulatory Visit: Payer: Self-pay

## 2020-10-02 ENCOUNTER — Encounter (HOSPITAL_COMMUNITY): Payer: Self-pay | Admitting: Emergency Medicine

## 2020-10-02 DIAGNOSIS — Z23 Encounter for immunization: Secondary | ICD-10-CM

## 2020-10-02 DIAGNOSIS — S99922A Unspecified injury of left foot, initial encounter: Secondary | ICD-10-CM

## 2020-10-02 MED ORDER — TETANUS-DIPHTH-ACELL PERTUSSIS 5-2.5-18.5 LF-MCG/0.5 IM SUSY
0.5000 mL | PREFILLED_SYRINGE | Freq: Once | INTRAMUSCULAR | Status: AC
  Administered 2020-10-02: 0.5 mL via INTRAMUSCULAR

## 2020-10-02 MED ORDER — TETANUS-DIPHTH-ACELL PERTUSSIS 5-2.5-18.5 LF-MCG/0.5 IM SUSY
PREFILLED_SYRINGE | INTRAMUSCULAR | Status: AC
  Filled 2020-10-02: qty 0.5
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
## 2020-10-02 NOTE — ED Provider Notes (Signed)
[facility]    CSN: [id number] Arrival date & time: 10/02/20  1614      History   Chief Complaint Chief Complaint  Patient presents with   Toe Pain    Left    HPI Jill Camacho is a 40 y.o. female.   Patient here for evaluation of left great toe pain.  Reports walking [place] the beach yesterday and felt something stabbed her in the toe.  Does not believe there are any foreign bodies left in toe.  No bleeding at this time.  Unknown last tetanus.  Denies any fevers, chest pain, shortness of breath, N/V/D, numbness, tingling, weakness, abdominal pain, or headaches.    The history is provided by the patient.  Toe Pain   Past Medical History:  Diagnosis Date   Acute blood loss anemia 10/24/2011   No pertinent past medical history    Postpartum care following vaginal delivery (11/26) 01/07/2014    Patient Active Problem List   Diagnosis Date Noted   Active labor 01/07/2014   Postpartum care following vaginal delivery (11/26) 01/07/2014   Acute blood loss anemia 10/24/2011    Past Surgical History:  Procedure Laterality Date   LEEP     WISDOM TOOTH EXTRACTION      OB History     Gravida  4   Para  2   Term  2   Preterm      AB  2   Living  2      SAB  2   IAB      Ectopic      Multiple  0   Live Births  2            Home Medications    Prior to Admission medications   Medication Sig Start Date End Date Taking? Authorizing Provider  Ascorbic Acid (VITAMIN C PO) Take 1 tablet by mouth daily.    [provider]  azithromycin (ZITHROMAX) 250 MG tablet Take 2 po first day and then one po qd x 4 days Patient not taking: Reported on 10/02/2019 06/02/15   Deatra Canter, FNP  HYDROcodone-homatropine (HYCODAN) 5-1.5 MG/5ML syrup Take 5 mLs by mouth every 6 (six) hours as needed for cough. Patient not taking: Reported on 10/02/2019 07/28/15   Judyann Munson, MD  ibuprofen (ADVIL,MOTRIN) 600 MG tablet Take 1 tablet (600 mg total)  by mouth every 6 (six) hours. Patient not taking: Reported on 10/02/2019 01/08/14   Raelyn Mora, CNM  lidocaine (XYLOCAINE) 2 % solution Use as directed 20 mLs in the mouth or throat as needed for mouth pain. Patient not taking: Reported on 10/02/2019 06/02/15   Deatra Canter, FNP  metoCLOPramide (REGLAN) 10 MG tablet Take 1 tablet (10 mg total) by mouth every 6 (six) hours as needed for nausea (nausea/headache). 10/02/19   Palumbo, April, MD  Multiple Vitamin (MULTIVITAMIN WITH MINERALS) TABS tablet Take 1 tablet by mouth daily.    [provider]  Multiple Vitamins-Minerals (ZINC PO) Take 1 tablet by mouth daily as needed.    [provider]    Family History Family History  Problem Relation Age of Onset   Heart disease Maternal Grandmother    Cancer Maternal Grandmother    Heart disease Maternal Grandfather    Cancer Maternal Grandfather    Cancer Paternal Grandmother    Cancer Paternal Grandfather     Social History Social History   Tobacco Use   Smoking status: Never   Smokeless  tobacco: Never  Substance Use Topics   Alcohol use: Yes    Comment: socially   Drug use: No     Allergies   Penicillins   Review of Systems Review of Systems  Musculoskeletal:  Positive for arthralgias.  All other systems reviewed and are negative.   Physical Exam Triage Vital Signs ED Triage Vitals  Enc Vitals Group     BP 10/02/20 1643 113/78     Pulse Rate 10/02/20 1643 74     Resp 10/02/20 1643 16     Temp 10/02/20 1643 98.6 F (37 C)     Temp Source 10/02/20 1643 Oral     SpO2 10/02/20 1643 99 %     Weight --      Height --      Head Circumference --      Peak Flow --      Pain Score 10/02/20 1641 0     Pain Loc --      Pain Edu? --      Excl. in GC? --    No data found.  Updated Vital Signs BP 113/78 (BP Location: Left Arm)   Pulse 74   Temp 98.6 F (37 C) (Oral)   Resp 16   SpO2 99%   Visual Acuity Right Eye Distance:   Left Eye  Distance:   Bilateral Distance:    Right Eye Near:   Left Eye Near:    Bilateral Near:     Physical Exam Vitals and nursing note reviewed.  Constitutional:      General: She is not in acute distress.    Appearance: Normal appearance. She is not ill-appearing, toxic-appearing or diaphoretic.  HENT:     Head: Normocephalic and atraumatic.  Eyes:     Conjunctiva/sclera: Conjunctivae normal.  Cardiovascular:     Rate and Rhythm: Normal rate.     Pulses: Normal pulses.  Pulmonary:     Effort: Pulmonary effort is normal.  Abdominal:     General: Abdomen is flat.  Musculoskeletal:        General: Normal range of motion.     Cervical back: Normal range of motion.     Left foot: Normal range of motion and normal capillary refill. Tenderness (small wound to left great toe nailbed, no erythema or drainage, minimal redness, no foreign bodies noted) present. Normal pulse.  Skin:    General: Skin is warm and dry.  Neurological:     General: No focal deficit present.     Mental Status: She is alert and oriented to person, place, and time.  Psychiatric:        Mood and Affect: Mood normal.     UC Treatments / Results  Labs (all labs ordered are listed, but only abnormal results are displayed) Labs Reviewed - No data to display  EKG   Radiology No results found.  Procedures Procedures (including critical care time)  Medications Ordered in UC Medications  Tdap (BOOSTRIX) injection 0.5 mL (has no administration in time range)    Initial Impression / Assessment and Plan / UC Course  I have reviewed the triage vital signs and the nursing notes.  Pertinent labs & imaging results that were available during my care of the patient were reviewed by me and considered in my medical decision making (see chart for details).    Assessment negative for red flags or concerns.  Left toe injury.  Tetanus shot was updated in office.  Recommend Epsom salt soaks or  warm water soaks for the  next few days.  May continue to apply antibiotic ointment as needed.  Follow-up for any signs of infection. Final Clinical Impressions(s) / UC Diagnoses   Final diagnoses:  Injury of toe on left foot, initial encounter     Discharge Instructions      Your tetanus shot was updated today.  Soak your toe in Epsom salt soak or warm water bath 3-4 times a day for comfort. Continue to apply antibiotic ointment or Neosporin  If you develop any worsening redness, swelling, pain, or drainage/discharge please return for reevaluation or go to your primary care provider.     ED Prescriptions   None    PDMP not reviewed this encounter.   Ivette Loyal, NP 10/02/20 [phone]

## 2020-10-02 NOTE — Discharge Instructions (Addendum)
Your tetanus shot was updated today.  Soak your toe in Epsom salt soak or warm water bath 3-4 times a day for comfort. Continue to apply antibiotic ointment or Neosporin  If you develop any worsening redness, swelling, pain, or drainage/discharge please return for reevaluation or go to your primary care provider.

## 2021-02-27 ENCOUNTER — Emergency Department (HOSPITAL_BASED_OUTPATIENT_CLINIC_OR_DEPARTMENT_OTHER)
Admission: EM | Admit: 2021-02-27 | Discharge: 2021-02-27 | Disposition: A | Discharge status: Home / Self Care | Payer: BC Managed Care – PPO | Attending: Emergency Medicine | Admitting: Emergency Medicine

## 2021-02-27 ENCOUNTER — Other Ambulatory Visit: Payer: Self-pay

## 2021-02-27 ENCOUNTER — Encounter (HOSPITAL_BASED_OUTPATIENT_CLINIC_OR_DEPARTMENT_OTHER): Payer: Self-pay | Admitting: Emergency Medicine

## 2021-02-27 DIAGNOSIS — B029 Zoster without complications: Secondary | ICD-10-CM | POA: Diagnosis not present

## 2021-02-27 DIAGNOSIS — M7918 Myalgia, other site: Secondary | ICD-10-CM

## 2021-02-27 DIAGNOSIS — M25552 Pain in left hip: Secondary | ICD-10-CM | POA: Diagnosis not present

## 2021-02-27 MED ORDER — KETOROLAC TROMETHAMINE 30 MG/ML IJ SOLN
30.0000 mg | Freq: Once | INTRAMUSCULAR | Status: AC
  Administered 2021-02-27: 30 mg via INTRAMUSCULAR
  Filled 2021-02-27: qty 1

## 2021-02-27 MED ORDER — VALACYCLOVIR HCL 1 G PO TABS: 1000.0000 mg | ORAL_TABLET | Freq: Three times a day (TID) | ORAL | 0 refills | Status: AC

## 2021-02-27 MED ORDER — GABAPENTIN 300 MG PO CAPS
300.0000 mg | ORAL_CAPSULE | Freq: Once | ORAL | Status: AC
  Administered 2021-02-27: 300 mg via ORAL
  Filled 2021-02-27: qty 1

## 2021-02-27 MED ORDER — GABAPENTIN 300 MG PO CAPS: 300.0000 mg | ORAL_CAPSULE | Freq: Two times a day (BID) | ORAL | 0 refills | Status: AC | PRN

## 2021-02-27 NOTE — Discharge Instructions (Signed)
You were seen today for buttock pain.  The distribution and description of pain as well as the small lesion on her buttock is concerning for early onset shingles.  Take medications as prescribed.  You may also take ibuprofen or Tylenol.

## 2021-02-27 NOTE — ED Provider Notes (Signed)
[facility] EMERGENCY DEPT Provider Note   CSN: [id number] Arrival date & time: 02/27/21  0310     History  Chief Complaint  Patient presents with   Hip Pain    Jill Camacho is a 41 y.o. female.  HPI     This is a 41 year old female who presents with achy burning pain of the left buttock.  Patient reports progressive symptoms over the last 24 hours.  She describes the pain as achy and burning.  She states that it starts in her left buttock but she has had some pain and sensations down her left leg into her foot.  No known injury.  States that tonight she was unable to get comfortable.  Has taken ibuprofen with varying relief.  Current pain is 8 out of 10.  No history of sciatica.  Denies weakness, numbness, tingling of lower extremities.  No bowel or bladder difficulty.  Of note, this evening she did note a small spot of redness on her left buttock.  She had shingles when she was in fifth or sixth grade.  Home Medications Prior to Admission medications   Medication Sig Start Date End Date Taking? Authorizing Provider  gabapentin (NEURONTIN) 300 MG capsule Take 1 capsule (300 mg total) by mouth 2 (two) times daily as needed. 02/27/21  Yes Yashua Bracco, Mayer Masker, MD  valACYclovir (VALTREX) 1000 MG tablet Take 1 tablet (1,000 mg total) by mouth 3 (three) times daily for 7 days. 02/27/21 03/06/21 Yes Joseth Weigel, Mayer Masker, MD  Ascorbic Acid (VITAMIN C PO) Take 1 tablet by mouth daily.    [provider]  azithromycin (ZITHROMAX) 250 MG tablet Take 2 po first day and then one po qd x 4 days Patient not taking: Reported on 10/02/2019 06/02/15   Deatra Canter, FNP  HYDROcodone-homatropine (HYCODAN) 5-1.5 MG/5ML syrup Take 5 mLs by mouth every 6 (six) hours as needed for cough. Patient not taking: Reported on 10/02/2019 07/28/15   Judyann Munson, MD  ibuprofen (ADVIL,MOTRIN) 600 MG tablet Take 1 tablet (600 mg total) by mouth every 6 (six) hours. Patient not taking:  Reported on 10/02/2019 01/08/14   Raelyn Mora, CNM  lidocaine (XYLOCAINE) 2 % solution Use as directed 20 mLs in the mouth or throat as needed for mouth pain. Patient not taking: Reported on 10/02/2019 06/02/15   Deatra Canter, FNP  metoCLOPramide (REGLAN) 10 MG tablet Take 1 tablet (10 mg total) by mouth every 6 (six) hours as needed for nausea (nausea/headache). 10/02/19   Palumbo, April, MD  Multiple Vitamin (MULTIVITAMIN WITH MINERALS) TABS tablet Take 1 tablet by mouth daily.    [provider]  Multiple Vitamins-Minerals (ZINC PO) Take 1 tablet by mouth daily as needed.    [provider]      Allergies    Penicillins    Review of Systems   Review of Systems  Constitutional:  Negative for fever.  Skin:  Positive for rash.  Neurological:  Negative for weakness and numbness.  All other systems reviewed and are negative.  Physical Exam Updated Vital Signs BP 121/83 (BP Location: Right Arm)    Pulse 78    Temp 98.1 F (36.7 C) (Oral)    Resp 17    Ht 1.702 m (5\' 7" )    Wt 70.3 kg    SpO2 100%    BMI 24.28 kg/m  Physical Exam Vitals and nursing note reviewed.  Constitutional:      Appearance: She is well-developed. She is not  ill-appearing.  HENT:     Head: Normocephalic and atraumatic.  Eyes:     Pupils: Pupils are equal, round, and reactive to light.  Cardiovascular:     Rate and Rhythm: Normal rate and regular rhythm.  Pulmonary:     Effort: Pulmonary effort is normal. No respiratory distress.  Abdominal:     Palpations: Abdomen is soft.  Musculoskeletal:     Cervical back: Neck supple.     Comments: No tenderness to palpation along the left buttock/glut or piriformis.  Skin:    General: Skin is warm and dry.     Comments: Small area of erythema with 2-3 distinct lesions on an erythematous base just left of the gluteal cleft does not cross midline  Neurological:     Mental Status: She is alert and oriented to person, place, and time.      Comments: Ambulatory with normal gait, 5 out of 5 strength bilateral lower extremities  Psychiatric:        Mood and Affect: Mood normal.    ED Results / Procedures / Treatments   Labs (all labs ordered are listed, but only abnormal results are displayed) Labs Reviewed - No data to display  EKG None  Radiology No results found.  Procedures Procedures    Medications Ordered in ED Medications  ketorolac (TORADOL) 30 MG/ML injection 30 mg (30 mg Intramuscular Given 02/27/21 0453)  gabapentin (NEURONTIN) capsule 300 mg (300 mg Oral Given 02/27/21 0454)    ED Course/ Medical Decision Making/ A&P                           Medical Decision Making  This patient presents to the ED for concern of left buttock pain, this involves an extensive number of treatment options, and is a complaint that carries with it a high risk of complications and morbidity.  The differential diagnosis includes sciatica, radiculopathy, piriformis syndrome, early shingles  MDM:    This is a 41 year old female who presents with buttock discomfort.  She is nontoxic and vital signs are reassuring.  She has burning pain of the left buttock.  There is a small early rash on an erythematous base which makes me think that this may be early shingles especially given the distribution and description of pain in the absence of injury.  It is less suspicious for piriformis syndrome or a radiculopathy.  Regardless, treatment is similar.  Given that she has a small rash, would opt to treat with Valtrex.  She was also given gabapentin and instructed to take anti-inflammatories.  No signs or symptoms of cauda equina or life-threatening injury (Labs, imaging)  Labs: I Ordered, and personally interpreted labs.  The pertinent results include: None  Imaging Studies ordered: I ordered imaging studies including none I independently visualized and interpreted imaging. I agree with the radiologist interpretation  Additional  history obtained from none.  External records from outside source obtained and reviewed including none  Critical Interventions: IM Toradol, gabapentin  Consultations: I requested consultation with the none,  and discussed lab and imaging findings as well as pertinent plan - they recommend: None  Cardiac Monitoring: The patient was maintained on a cardiac monitor.  I personally viewed and interpreted the cardiac monitored which showed an underlying rhythm of: None  Reevaluation: After the interventions noted above, I reevaluated the patient and found that they have : Improved  Social Determinants of Health: Lives independently  Disposition: Discharge  Co morbidities  that complicate the patient evaluation  Past Medical History:  Diagnosis Date   Acute blood loss anemia 10/24/2011   No pertinent past medical history    Postpartum care following vaginal delivery (11/26) 01/07/2014     Medicines Meds ordered this encounter  Medications   ketorolac (TORADOL) 30 MG/ML injection 30 mg   gabapentin (NEURONTIN) capsule 300 mg   valACYclovir (VALTREX) 1000 MG tablet    Sig: Take 1 tablet (1,000 mg total) by mouth 3 (three) times daily for 7 days.    Dispense:  21 tablet    Refill:  0   gabapentin (NEURONTIN) 300 MG capsule    Sig: Take 1 capsule (300 mg total) by mouth 2 (two) times daily as needed.    Dispense:  60 capsule    Refill:  0    I have reviewed the patients home medicines and have made adjustments as needed  Problem List / ED Course: Problem List Items Addressed This Visit   None Visit Diagnoses     Buttock pain    -  Primary   Herpes zoster without complication       Relevant Medications   valACYclovir (VALTREX) 1000 MG tablet                   Final Clinical Impression(s) / ED Diagnoses Final diagnoses:  Buttock pain  Herpes zoster without complication    Rx / DC Orders ED Discharge Orders          Ordered    valACYclovir (VALTREX) 1000  MG tablet  3 times daily        02/27/21 0532    gabapentin (NEURONTIN) 300 MG capsule  2 times daily PRN        02/27/21 0532              Shon BatonHorton, Bevin Das F, MD 02/27/21 [phone]

## 2021-02-27 NOTE — ED Triage Notes (Signed)
°  Patient comes in with 1 day hx of L upper gluteal pain.  Patient states it was achy when she first woke up yesterday and has gotten progressively worse.  Patient states she feels a burning pain and when she sits down it feels like a muscle soreness.  Has been taking ibuprofen for pain control at home.  Last dose 600 mg around 2300.  Pain 8/10, constant burning pain.  No known injury.

## 2021-03-14 DIAGNOSIS — D649 Anemia, unspecified: Secondary | ICD-10-CM | POA: Diagnosis not present

## 2021-03-14 DIAGNOSIS — Z13220 Encounter for screening for lipoid disorders: Secondary | ICD-10-CM | POA: Diagnosis not present

## 2021-03-14 DIAGNOSIS — Z0000 Encounter for general adult medical examination without abnormal findings: Secondary | ICD-10-CM | POA: Diagnosis not present

## 2021-03-14 DIAGNOSIS — Z131 Encounter for screening for diabetes mellitus: Secondary | ICD-10-CM | POA: Diagnosis not present

## 2021-05-31 DIAGNOSIS — Z1231 Encounter for screening mammogram for malignant neoplasm of breast: Secondary | ICD-10-CM | POA: Diagnosis not present

## 2021-10-27 DIAGNOSIS — Z01411 Encounter for gynecological examination (general) (routine) with abnormal findings: Secondary | ICD-10-CM | POA: Diagnosis not present

## 2021-10-27 DIAGNOSIS — Z113 Encounter for screening for infections with a predominantly sexual mode of transmission: Secondary | ICD-10-CM | POA: Diagnosis not present

## 2021-10-27 DIAGNOSIS — Z01419 Encounter for gynecological examination (general) (routine) without abnormal findings: Secondary | ICD-10-CM | POA: Diagnosis not present

## 2021-10-27 DIAGNOSIS — Z124 Encounter for screening for malignant neoplasm of cervix: Secondary | ICD-10-CM | POA: Diagnosis not present

## 2021-10-27 DIAGNOSIS — Z0142 Encounter for cervical smear to confirm findings of recent normal smear following initial abnormal smear: Secondary | ICD-10-CM | POA: Diagnosis not present

## 2022-02-10 IMAGING — CT CT HEAD W/O CM
4 series · 16 of 47 positions shown, 18 images · non-contrast
Comparison: None.

CLINICAL DATA: Vertigo

EXAM:
CT HEAD WITHOUT CONTRAST
TECHNIQUE: Contiguous axial images were obtained from the base of the skull
through the vertex without intravenous contrast.

[Series 3: head without · axial · non-contrast · 0.42mm/px · z∈[-46,+79]mm · 7 of 35 slices shown, 9 images]
[im 5/35  brain]
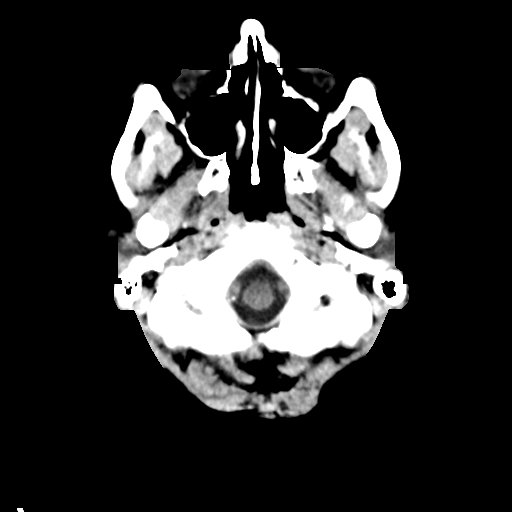
[im 5/35  bone]
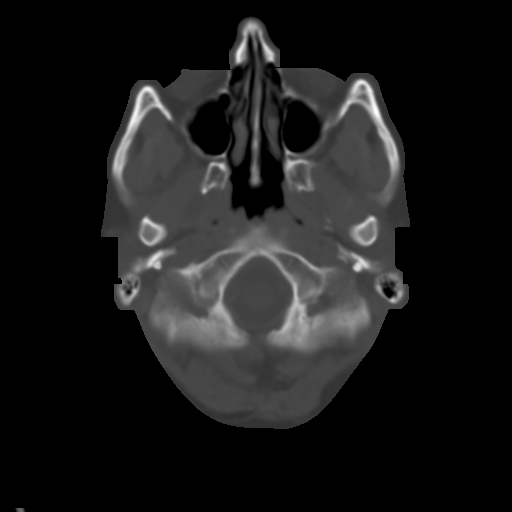
[im 9/35  brain]
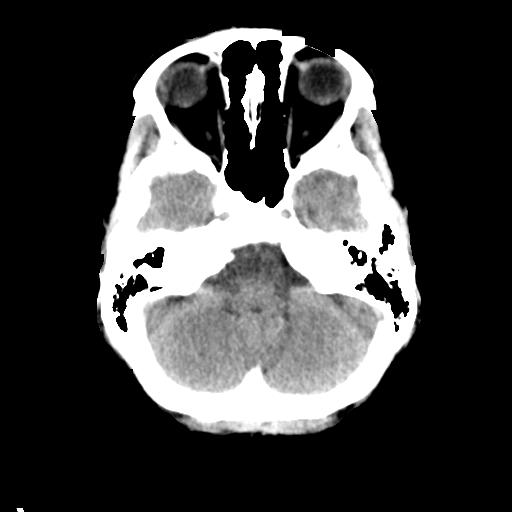
[im 13/35  brain]
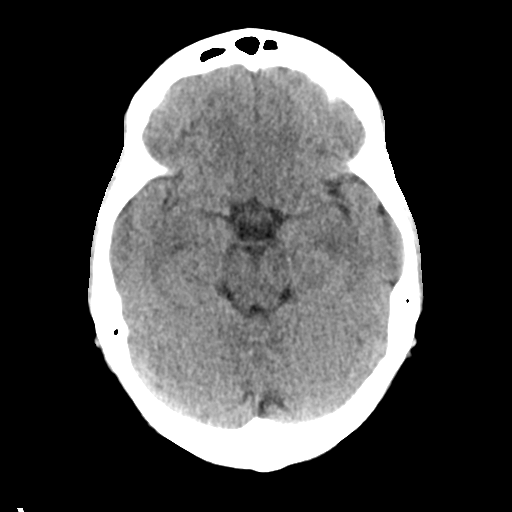
[im 18/35  brain]
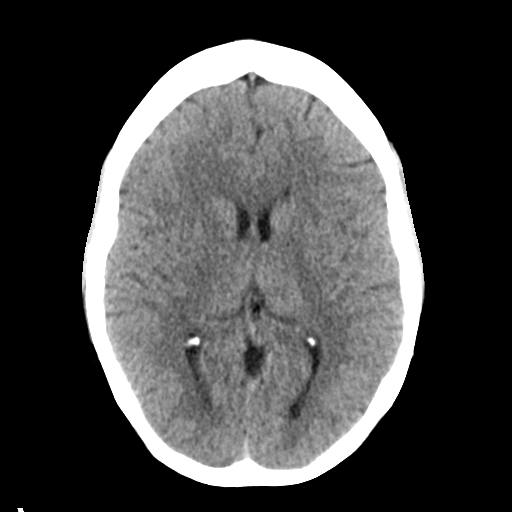
[im 22/35  brain]
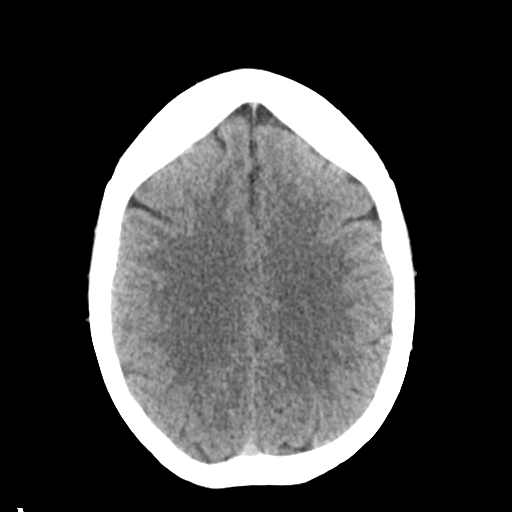
[im 22/35  bone]
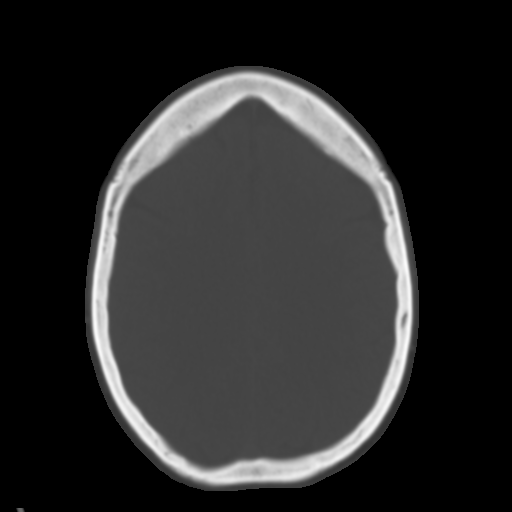
[im 26/35  brain]
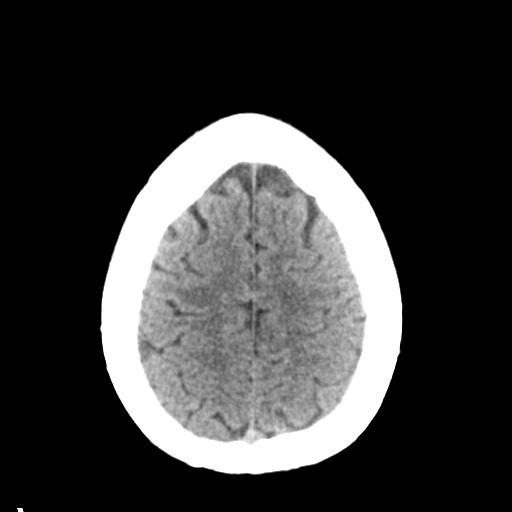
[im 30/35  brain]
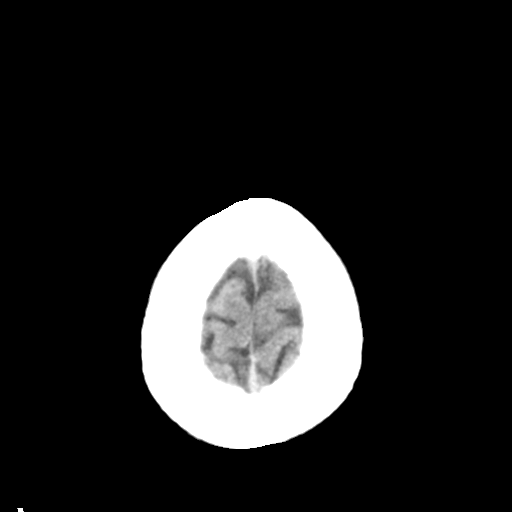

[Series 4: head bone · axial · 0.42mm/px · z∈[-50,-16]mm · 3 of 86 slices shown]
[im 9/86  bone]
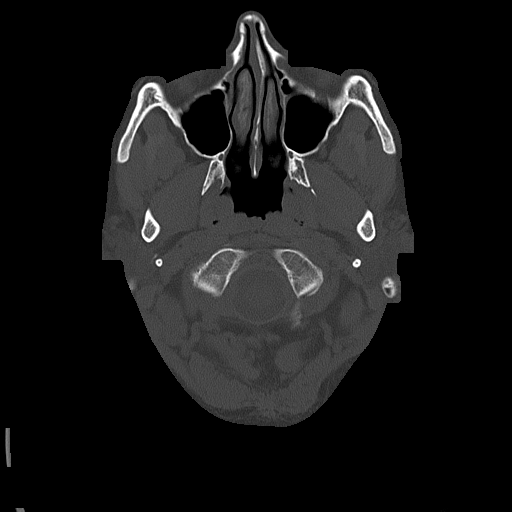
[im 18/86  bone]
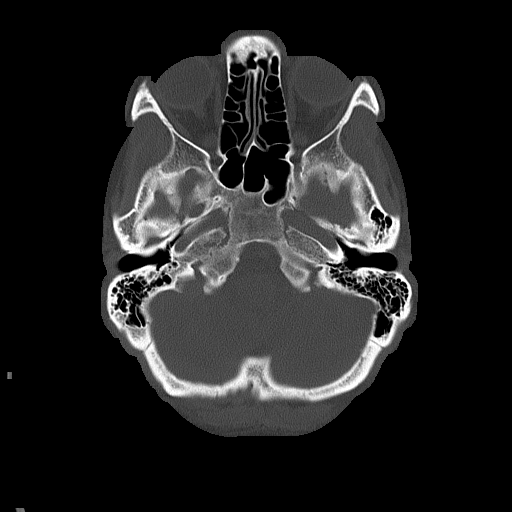
[im 26/86  bone]
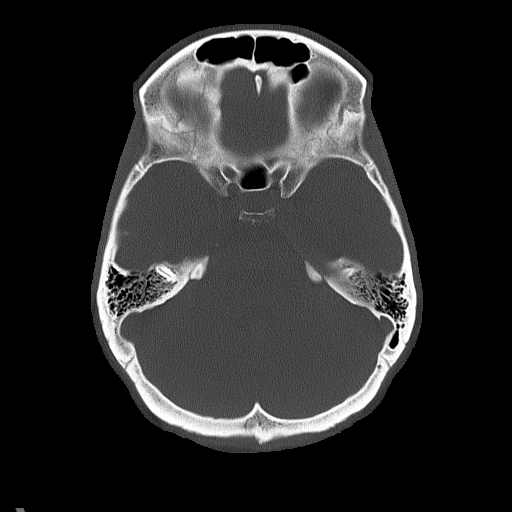

[Series 5: head without cor · coronal · non-contrast · 0.30mm/px · 3 of 65 slices shown]
[im 22/65  brain]
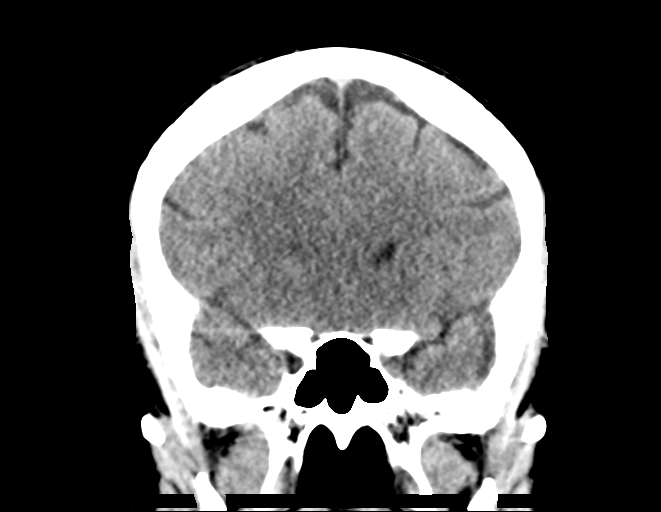
[im 29/65  brain]
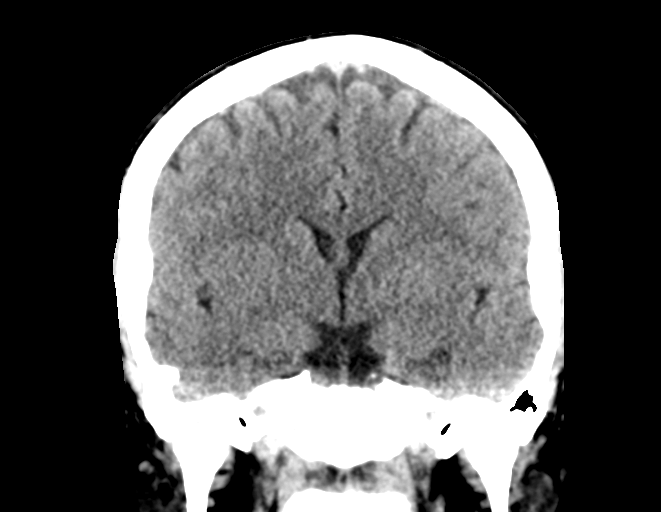
[im 36/65  brain]
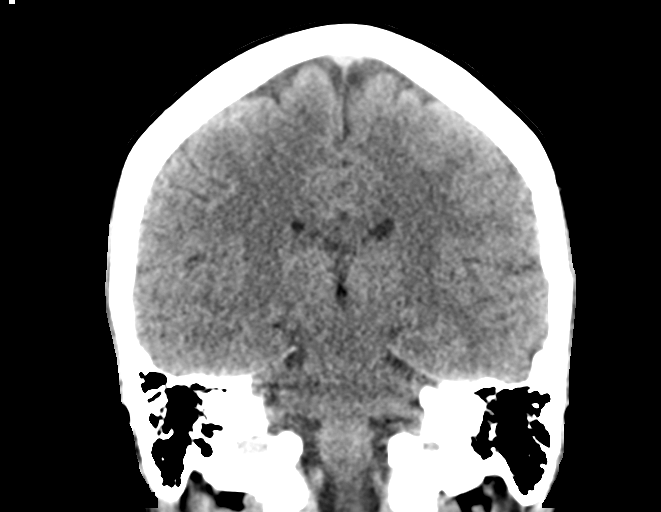

[Series 6: head without sag · sagittal · non-contrast · 0.31mm/px · 3 of 51 slices shown]
[im 17/51  brain]
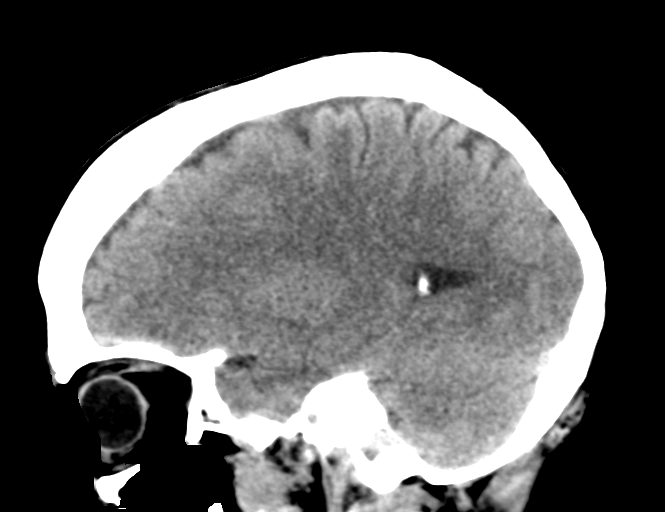
[im 26/51  brain]
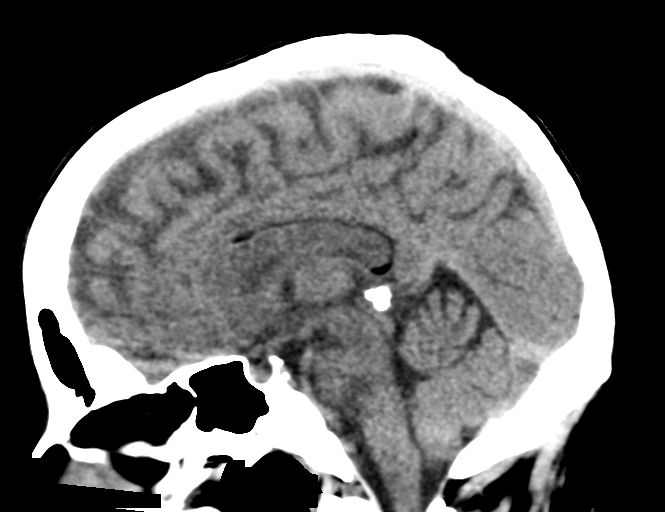
[im 34/51  brain]
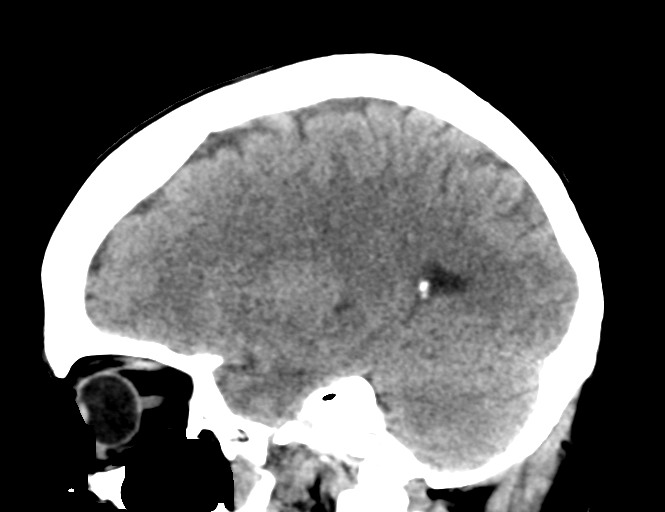

[16 of 47 positions shown; findings below may reference images not displayed]

FINDINGS: Brain: There is no mass, hemorrhage or extra-axial collection. The
size and configuration of the ventricles and extra-axial CSF spaces
are normal. The brain parenchyma is normal, without acute or chronic
infarction.

Vascular: No abnormal hyperdensity of the major intracranial
arteries or dural venous sinuses. No intracranial atherosclerosis.

Skull: The visualized skull base, calvarium and extracranial soft
tissues are normal.

Sinuses/Orbits: No fluid levels or advanced mucosal thickening of
the visualized paranasal sinuses. No mastoid or middle ear effusion.
The orbits are normal.
IMPRESSION: Normal head CT.

## 2022-03-19 DIAGNOSIS — Z0000 Encounter for general adult medical examination without abnormal findings: Secondary | ICD-10-CM | POA: Diagnosis not present

## 2022-03-19 DIAGNOSIS — Z13220 Encounter for screening for lipoid disorders: Secondary | ICD-10-CM | POA: Diagnosis not present

## 2022-05-01 DIAGNOSIS — Z3202 Encounter for pregnancy test, result negative: Secondary | ICD-10-CM | POA: Diagnosis not present

## 2022-05-01 DIAGNOSIS — Z30431 Encounter for routine checking of intrauterine contraceptive device: Secondary | ICD-10-CM | POA: Diagnosis not present

## 2022-05-01 DIAGNOSIS — Z30433 Encounter for removal and reinsertion of intrauterine contraceptive device: Secondary | ICD-10-CM | POA: Diagnosis not present

## 2022-06-01 DIAGNOSIS — Z30431 Encounter for routine checking of intrauterine contraceptive device: Secondary | ICD-10-CM | POA: Diagnosis not present

## 2022-06-01 DIAGNOSIS — T8389XA Other specified complication of genitourinary prosthetic devices, implants and grafts, initial encounter: Secondary | ICD-10-CM | POA: Diagnosis not present

## 2022-06-08 ENCOUNTER — Other Ambulatory Visit: Payer: Self-pay

## 2022-06-08 ENCOUNTER — Ambulatory Visit
Admission: EM | Admit: 2022-06-08 | Discharge: 2022-06-08 | Disposition: A | Discharge status: Home / Self Care | Payer: BC Managed Care – PPO | Attending: Family Medicine | Admitting: Family Medicine

## 2022-06-08 ENCOUNTER — Encounter: Payer: Self-pay | Admitting: Emergency Medicine

## 2022-06-08 DIAGNOSIS — Z049 Encounter for examination and observation for unspecified reason: Secondary | ICD-10-CM | POA: Diagnosis not present

## 2022-06-08 NOTE — ED Triage Notes (Signed)
Patient presents to [facility] for evaluation of 2 weeks of foreign body in right hand after she caught some falling furniture while moving and got her hand caught up in a broken window.

## 2022-06-08 NOTE — ED Provider Notes (Signed)
[facility]    CSN: [id number] Arrival date & time: 06/08/22  1447      History   Chief Complaint Chief Complaint  Patient presents with   Foreign Body in Skin    HPI DALTON MILLE is a 42 y.o. female.   HPI  Past Medical History:  Diagnosis Date   Acute blood loss anemia 10/24/2011   No pertinent past medical history    Postpartum care following vaginal delivery (11/26) 01/07/2014    Patient Active Problem List   Diagnosis Date Noted   Active labor 01/07/2014   Postpartum care following vaginal delivery (11/26) 01/07/2014   Acute blood loss anemia 10/24/2011    Past Surgical History:  Procedure Laterality Date   LEEP     WISDOM TOOTH EXTRACTION      OB History     Gravida  4   Para  2   Term  2   Preterm      AB  2   Living  2      SAB  2   IAB      Ectopic      Multiple  0   Live Births  2            Home Medications    Prior to Admission medications   Medication Sig Start Date End Date Taking? Authorizing Provider  Ascorbic Acid (VITAMIN C PO) Take 1 tablet by mouth daily.    [provider]  azithromycin (ZITHROMAX) 250 MG tablet Take 2 po first day and then one po qd x 4 days Patient not taking: Reported on 10/02/2019 06/02/15   Deatra Canter, FNP  gabapentin (NEURONTIN) 300 MG capsule Take 1 capsule (300 mg total) by mouth 2 (two) times daily as needed. 02/27/21   Horton, Mayer Masker, MD  HYDROcodone-homatropine (HYCODAN) 5-1.5 MG/5ML syrup Take 5 mLs by mouth every 6 (six) hours as needed for cough. Patient not taking: Reported on 10/02/2019 07/28/15   Judyann Munson, MD  ibuprofen (ADVIL,MOTRIN) 600 MG tablet Take 1 tablet (600 mg total) by mouth every 6 (six) hours. Patient not taking: Reported on 10/02/2019 01/08/14   Raelyn Mora, CNM  lidocaine (XYLOCAINE) 2 % solution Use as directed 20 mLs in the mouth or throat as needed for mouth pain. Patient not taking: Reported on 10/02/2019 06/02/15    Deatra Canter, FNP  metoCLOPramide (REGLAN) 10 MG tablet Take 1 tablet (10 mg total) by mouth every 6 (six) hours as needed for nausea (nausea/headache). 10/02/19   Palumbo, April, MD  Multiple Vitamin (MULTIVITAMIN WITH MINERALS) TABS tablet Take 1 tablet by mouth daily.    [provider]  Multiple Vitamins-Minerals (ZINC PO) Take 1 tablet by mouth daily as needed.    [provider]    Family History Family History  Problem Relation Age of Onset   Heart disease Maternal Grandmother    Cancer Maternal Grandmother    Heart disease Maternal Grandfather    Cancer Maternal Grandfather    Cancer Paternal Grandmother    Cancer Paternal Grandfather     Social History Social History   Tobacco Use   Smoking status: Never   Smokeless tobacco: Never  Substance Use Topics   Alcohol use: Yes    Comment: socially   Drug use: No     Allergies   Penicillins   Review of Systems Review of Systems   Physical Exam Triage Vital Signs ED Triage Vitals  Enc Vitals Group  BP 06/08/22 1502 117/81     Pulse Rate 06/08/22 1502 86     Resp 06/08/22 1502 16     Temp 06/08/22 1502 98.2 F (36.8 C)     Temp Source 06/08/22 1502 Oral     SpO2 06/08/22 1502 100 %     Weight --      Height --      Head Circumference --      Peak Flow --      Pain Score 06/08/22 1501 0     Pain Loc --      Pain Edu? --      Excl. in GC? --    No data found.  Updated Vital Signs BP 117/81 (BP Location: Left Arm)   Pulse 86   Temp 98.2 F (36.8 C) (Oral)   Resp 16   LMP 05/31/2022   SpO2 100%   Visual Acuity Right Eye Distance:   Left Eye Distance:   Bilateral Distance:    Right Eye Near:   Left Eye Near:    Bilateral Near:     Physical Exam   UC Treatments / Results  Labs (all labs ordered are listed, but only abnormal results are displayed) Labs Reviewed - No data to display  EKG   Radiology No results found.  Procedures Procedures (including  critical care time)  Medications Ordered in UC Medications - No data to display  Initial Impression / Assessment and Plan / UC Course  I have reviewed the triage vital signs and the nursing notes.  Pertinent labs & imaging results that were available during my care of the patient were reviewed by me and considered in my medical decision making (see chart for details).    Reassurance. No evidence foreign body during today's exploration. Recommend follow-up if symptoms worsen.  Final Clinical Impressions(s) / UC Diagnoses   Final diagnoses:  Suspected condition not found     Discharge Instructions      Change dressing daily and apply Bacitracin ointment to wound until healed.  Keep wound clean and dry.  Return for any signs of infection (or follow-up with family doctor):  Increasing redness, swelling, pain, heat, drainage, etc.       ED Prescriptions   None    PDMP not reviewed this encounter.

## 2022-06-08 NOTE — Discharge Instructions (Signed)
Change dressing daily and apply Bacitracin ointment to wound until healed.  Keep wound clean and dry.  Return for any signs of infection (or follow-up with family doctor):  Increasing redness, swelling, pain, heat, drainage, etc.   

## 2022-06-18 DIAGNOSIS — Z1231 Encounter for screening mammogram for malignant neoplasm of breast: Secondary | ICD-10-CM | POA: Diagnosis not present

## 2022-08-07 ENCOUNTER — Other Ambulatory Visit (HOSPITAL_BASED_OUTPATIENT_CLINIC_OR_DEPARTMENT_OTHER): Payer: Self-pay

## 2022-08-07 DIAGNOSIS — M545 Low back pain, unspecified: Secondary | ICD-10-CM | POA: Diagnosis not present

## 2022-08-07 MED ORDER — METHOCARBAMOL 500 MG PO TABS
500.0000 mg | ORAL_TABLET | Freq: Three times a day (TID) | ORAL | 0 refills | Status: AC
  Filled 2022-08-07: qty 15, 5d supply, fill #0

## 2022-08-07 MED ORDER — DICLOFENAC SODIUM 50 MG PO TBEC
50.0000 mg | DELAYED_RELEASE_TABLET | Freq: Two times a day (BID) | ORAL | 0 refills | Status: AC
  Filled 2022-08-07: qty 20, 10d supply, fill #0

## 2022-08-23 ENCOUNTER — Ambulatory Visit: Payer: BC Managed Care – PPO | Admitting: Physical Medicine and Rehabilitation

## 2022-10-16 DIAGNOSIS — S39012A Strain of muscle, fascia and tendon of lower back, initial encounter: Secondary | ICD-10-CM | POA: Diagnosis not present

## 2022-10-18 DIAGNOSIS — M79672 Pain in left foot: Secondary | ICD-10-CM | POA: Diagnosis not present

## 2022-10-18 DIAGNOSIS — M21612 Bunion of left foot: Secondary | ICD-10-CM | POA: Diagnosis not present

## 2022-10-18 DIAGNOSIS — M79671 Pain in right foot: Secondary | ICD-10-CM | POA: Diagnosis not present

## 2022-10-18 DIAGNOSIS — M21611 Bunion of right foot: Secondary | ICD-10-CM | POA: Diagnosis not present

## 2022-11-06 DIAGNOSIS — S39012D Strain of muscle, fascia and tendon of lower back, subsequent encounter: Secondary | ICD-10-CM | POA: Diagnosis not present

## 2022-11-06 DIAGNOSIS — M6281 Muscle weakness (generalized): Secondary | ICD-10-CM | POA: Diagnosis not present

## 2022-11-14 DIAGNOSIS — S39012D Strain of muscle, fascia and tendon of lower back, subsequent encounter: Secondary | ICD-10-CM | POA: Diagnosis not present

## 2022-11-14 DIAGNOSIS — M6281 Muscle weakness (generalized): Secondary | ICD-10-CM | POA: Diagnosis not present

## 2022-11-21 DIAGNOSIS — R109 Unspecified abdominal pain: Secondary | ICD-10-CM | POA: Diagnosis not present

## 2022-11-22 DIAGNOSIS — M6281 Muscle weakness (generalized): Secondary | ICD-10-CM | POA: Diagnosis not present

## 2022-11-22 DIAGNOSIS — S39012D Strain of muscle, fascia and tendon of lower back, subsequent encounter: Secondary | ICD-10-CM | POA: Diagnosis not present

## 2023-05-23 DIAGNOSIS — G479 Sleep disorder, unspecified: Secondary | ICD-10-CM | POA: Diagnosis not present

## 2023-05-23 DIAGNOSIS — Z1331 Encounter for screening for depression: Secondary | ICD-10-CM | POA: Diagnosis not present

## 2023-05-23 DIAGNOSIS — Z01419 Encounter for gynecological examination (general) (routine) without abnormal findings: Secondary | ICD-10-CM | POA: Diagnosis not present

## 2023-06-20 DIAGNOSIS — Z1231 Encounter for screening mammogram for malignant neoplasm of breast: Secondary | ICD-10-CM | POA: Diagnosis not present

## 2023-07-02 DIAGNOSIS — G4720 Circadian rhythm sleep disorder, unspecified type: Secondary | ICD-10-CM | POA: Diagnosis not present

## 2023-07-02 DIAGNOSIS — F419 Anxiety disorder, unspecified: Secondary | ICD-10-CM | POA: Diagnosis not present

## 2023-10-03 DIAGNOSIS — M2012 Hallux valgus (acquired), left foot: Secondary | ICD-10-CM | POA: Diagnosis not present

## 2023-10-03 DIAGNOSIS — M24571 Contracture, right ankle: Secondary | ICD-10-CM | POA: Diagnosis not present

## 2023-10-03 DIAGNOSIS — M7671 Peroneal tendinitis, right leg: Secondary | ICD-10-CM | POA: Diagnosis not present

## 2023-10-03 DIAGNOSIS — M2011 Hallux valgus (acquired), right foot: Secondary | ICD-10-CM | POA: Diagnosis not present

## 2023-11-04 DIAGNOSIS — M7671 Peroneal tendinitis, right leg: Secondary | ICD-10-CM | POA: Diagnosis not present

## 2023-11-26 ENCOUNTER — Other Ambulatory Visit: Payer: Self-pay | Admitting: Internal Medicine

## 2023-11-26 DIAGNOSIS — J309 Allergic rhinitis, unspecified: Secondary | ICD-10-CM | POA: Diagnosis not present

## 2023-11-26 DIAGNOSIS — E7800 Pure hypercholesterolemia, unspecified: Secondary | ICD-10-CM | POA: Diagnosis not present

## 2023-11-26 DIAGNOSIS — Z1211 Encounter for screening for malignant neoplasm of colon: Secondary | ICD-10-CM | POA: Diagnosis not present

## 2023-11-26 DIAGNOSIS — R1011 Right upper quadrant pain: Secondary | ICD-10-CM

## 2023-11-26 DIAGNOSIS — Z0000 Encounter for general adult medical examination without abnormal findings: Secondary | ICD-10-CM | POA: Diagnosis not present

## 2023-12-03 ENCOUNTER — Other Ambulatory Visit

## 2023-12-04 ENCOUNTER — Other Ambulatory Visit

## 2023-12-05 ENCOUNTER — Ambulatory Visit
Admission: RE | Admit: 2023-12-05 | Discharge: 2023-12-05 | Disposition: A | Source: Ambulatory Visit | Attending: Internal Medicine

## 2023-12-05 DIAGNOSIS — R1011 Right upper quadrant pain: Secondary | ICD-10-CM

## 2023-12-05 DIAGNOSIS — K8020 Calculus of gallbladder without cholecystitis without obstruction: Secondary | ICD-10-CM | POA: Diagnosis not present

## 2023-12-10 DIAGNOSIS — Z1211 Encounter for screening for malignant neoplasm of colon: Secondary | ICD-10-CM | POA: Diagnosis not present

## 2023-12-20 DIAGNOSIS — K8020 Calculus of gallbladder without cholecystitis without obstruction: Secondary | ICD-10-CM | POA: Diagnosis not present
